# Patient Record
Sex: Female | Born: 1970
Health system: Southern US, Community
[De-identification: ages and names within clinical notes are randomized; demographics above are authoritative.]

## PROBLEM LIST (undated history)

## (undated) DIAGNOSIS — I10 Essential (primary) hypertension: Secondary | ICD-10-CM

## (undated) HISTORY — PX: POLYPECTOMY: SHX149

## (undated) HISTORY — PX: DILATION AND CURETTAGE OF UTERUS: SHX78

---

## 1997-12-02 ENCOUNTER — Encounter: Admission: RE | Admit: 1997-12-02 | Discharge: 1997-12-02 | Payer: Self-pay | Admitting: Internal Medicine

## 1998-09-04 ENCOUNTER — Other Ambulatory Visit: Admission: RE | Admit: 1998-09-04 | Discharge: 1998-09-04 | Payer: Self-pay | Admitting: Obstetrics and Gynecology

## 1998-10-05 ENCOUNTER — Ambulatory Visit (HOSPITAL_COMMUNITY): Admission: RE | Admit: 1998-10-05 | Discharge: 1998-10-05 | Payer: Self-pay | Admitting: Infectious Diseases

## 1999-05-31 HISTORY — PX: NOVASURE ABLATION: SHX5394

## 1999-10-05 ENCOUNTER — Other Ambulatory Visit: Admission: RE | Admit: 1999-10-05 | Discharge: 1999-10-05 | Payer: Self-pay | Admitting: Obstetrics and Gynecology

## 2000-11-15 ENCOUNTER — Other Ambulatory Visit: Admission: RE | Admit: 2000-11-15 | Discharge: 2000-11-15 | Payer: Self-pay | Admitting: Obstetrics and Gynecology

## 2002-03-06 ENCOUNTER — Other Ambulatory Visit: Admission: RE | Admit: 2002-03-06 | Discharge: 2002-03-06 | Payer: Self-pay | Admitting: Obstetrics and Gynecology

## 2003-02-16 ENCOUNTER — Inpatient Hospital Stay (HOSPITAL_COMMUNITY): Admission: AD | Admit: 2003-02-16 | Discharge: 2003-02-16 | Payer: Self-pay | Admitting: Obstetrics and Gynecology

## 2003-04-08 ENCOUNTER — Other Ambulatory Visit: Admission: RE | Admit: 2003-04-08 | Discharge: 2003-04-08 | Payer: Self-pay | Admitting: Obstetrics and Gynecology

## 2004-11-02 ENCOUNTER — Inpatient Hospital Stay (HOSPITAL_COMMUNITY): Admission: AD | Admit: 2004-11-02 | Discharge: 2004-11-02 | Payer: Self-pay | Admitting: Obstetrics and Gynecology

## 2005-04-06 ENCOUNTER — Other Ambulatory Visit: Admission: RE | Admit: 2005-04-06 | Discharge: 2005-04-06 | Payer: Self-pay | Admitting: Family Medicine

## 2005-12-06 ENCOUNTER — Ambulatory Visit: Payer: Self-pay | Admitting: Family Medicine

## 2006-01-16 ENCOUNTER — Ambulatory Visit: Payer: Self-pay | Admitting: Family Medicine

## 2006-03-30 ENCOUNTER — Ambulatory Visit: Payer: Self-pay | Admitting: Family Medicine

## 2006-03-30 LAB — CONVERTED CEMR LAB
Cholesterol: 164 mg/dL (ref 0–200)
HDL: 35 mg/dL — ABNORMAL LOW (ref >39.0)
LDL Cholesterol: 121 mg/dL — ABNORMAL HIGH (ref 0–99)
MCV: 78.9 fL (ref 78.0–100.0)
Platelets: 378 10*3/uL (ref 150–400)
RBC: 4.52 M/uL (ref 3.87–5.11)
RDW: 12.3 % (ref 11.5–14.6)
Triglyceride fasting, serum: 40 mg/dL (ref 0–149)
VLDL: 8 mg/dL (ref 0–40)

## 2006-04-28 ENCOUNTER — Encounter: Admission: RE | Admit: 2006-04-28 | Discharge: 2006-07-27 | Payer: Self-pay | Admitting: Family Medicine

## 2006-07-12 DIAGNOSIS — I10 Essential (primary) hypertension: Secondary | ICD-10-CM | POA: Insufficient documentation

## 2006-08-30 ENCOUNTER — Encounter: Payer: Self-pay | Admitting: Family Medicine

## 2006-08-30 ENCOUNTER — Ambulatory Visit: Payer: Self-pay | Admitting: Family Medicine

## 2006-08-30 DIAGNOSIS — J309 Allergic rhinitis, unspecified: Secondary | ICD-10-CM | POA: Insufficient documentation

## 2006-08-30 DIAGNOSIS — J019 Acute sinusitis, unspecified: Secondary | ICD-10-CM

## 2006-12-04 ENCOUNTER — Telehealth (INDEPENDENT_AMBULATORY_CARE_PROVIDER_SITE_OTHER): Payer: Self-pay | Admitting: *Deleted

## 2007-05-16 ENCOUNTER — Ambulatory Visit: Payer: Self-pay | Admitting: Family Medicine

## 2007-05-16 DIAGNOSIS — E039 Hypothyroidism, unspecified: Secondary | ICD-10-CM | POA: Insufficient documentation

## 2007-05-17 ENCOUNTER — Telehealth (INDEPENDENT_AMBULATORY_CARE_PROVIDER_SITE_OTHER): Payer: Self-pay | Admitting: *Deleted

## 2007-05-17 LAB — CONVERTED CEMR LAB
BUN: 6 mg/dL (ref 6–23)
Basophils Absolute: 0 10*3/uL (ref 0.0–0.1)
Basophils Relative: 0.4 % (ref 0.0–1.0)
Eosinophils Relative: 1.5 % (ref 0.0–5.0)
GFR calc Af Amer: 103 mL/min
GFR calc non Af Amer: 85 mL/min
HCT: 39.4 % (ref 36.0–46.0)
MCV: 78.4 fL (ref 78.0–100.0)
Monocytes Absolute: 0.2 10*3/uL (ref 0.2–0.7)
Neutro Abs: 4.1 10*3/uL (ref 1.4–7.7)
Platelets: 370 10*3/uL (ref 150–400)
Potassium: 3.6 meq/L (ref 3.5–5.1)
RBC: 5.02 M/uL (ref 3.87–5.11)
RDW: 13.2 % (ref 11.5–14.6)
TSH: 1.28 microintl units/mL (ref 0.35–5.50)
Total CHOL/HDL Ratio: 3.7
Triglycerides: 29 mg/dL (ref 0–149)
VLDL: 6 mg/dL (ref 0–40)
WBC: 6.7 10*3/uL (ref 4.5–10.5)

## 2007-05-18 ENCOUNTER — Telehealth (INDEPENDENT_AMBULATORY_CARE_PROVIDER_SITE_OTHER): Payer: Self-pay | Admitting: *Deleted

## 2007-07-02 ENCOUNTER — Telehealth (INDEPENDENT_AMBULATORY_CARE_PROVIDER_SITE_OTHER): Payer: Self-pay | Admitting: *Deleted

## 2008-12-17 ENCOUNTER — Encounter (INDEPENDENT_AMBULATORY_CARE_PROVIDER_SITE_OTHER): Payer: Self-pay | Admitting: Obstetrics and Gynecology

## 2008-12-17 ENCOUNTER — Ambulatory Visit (HOSPITAL_COMMUNITY): Admission: RE | Admit: 2008-12-17 | Discharge: 2008-12-17 | Payer: Self-pay | Admitting: Obstetrics and Gynecology

## 2010-09-05 LAB — CBC
Platelets: 361 10*3/uL (ref 150–400)
WBC: 6.3 10*3/uL (ref 4.0–10.5)

## 2010-09-05 LAB — BASIC METABOLIC PANEL
BUN: 8 mg/dL (ref 6–23)
GFR calc non Af Amer: >60 mL/min (ref >60)
Potassium: 3.7 mEq/L (ref 3.5–5.1)
Sodium: 136 mEq/L (ref 135–145)

## 2010-09-05 LAB — HCG, SERUM, QUALITATIVE: Preg, Serum: NEGATIVE

## 2010-10-12 NOTE — Op Note (Signed)
[name], [name]                  ACCOUNT NO.:  [id number]   MEDICAL RECORD NO.:  [id number]          PATIENT TYPE:  AMB   LOCATION:  SDC                           FACILITY:  [facility]   PHYSICIAN:  Lenoard Aden, M.D.DATE OF BIRTH:  [birth year]   DATE OF PROCEDURE:  12/17/2008  DATE OF DISCHARGE:  12/17/2008                               OPERATIVE REPORT   PREOPERATIVE DIAGNOSIS:  Menorrhagia, submucous fibroid.   POSTOPERATIVE DIAGNOSIS:  Menorrhagia, submucous fibroid.   PROCEDURE:  Diagnostic hysteroscopy, dilation and curettage,  resectoscope with myomectomy, and NovaSure endometrial ablation.   SURGEON:  Lenoard Aden, MD   ANESTHESIA:  General.   ESTIMATED BLOOD LOSS:  Less than 50 mL.   COMPLICATIONS:  None.   DRAINS:  None.   COUNTS:  Correct.   The patient went to recovery in good condition.   FLUID DEFICIT:  150 mL.   BRIEF OPERATIVE NOTE:  After being apprised of risks of anesthesia,  infection, bleeding, injury to abdominal organs, need for repair,  delayed versus immediate complications to include bowel and bladder  injury.  The patient brought to the operating, she administered general  anesthetic without complications, prepped and draped in usual sterile  fashion, catheterized still the bladder was empty.  Exam under  anesthesia was a bulky anteflexed uterus.  No adnexal masses.  Uterus  was dilated, then dilute paracervical block with 20 mL of a dilute  Marcaine solution placed in the standard fashion.  A block at the  cervicovaginal junction at 3 and 9 o'clock using a dilute Pitressin  solution was placed as well.  Cervix was then easily dilated up to #25  [facility] dilator.  Hysteroscope was placed, anterior fibroid was noted, and  resected using multiple passes of a right-angle loop with good  hemostasis noted.  Otherwise, normal endometrial cavity was noted.  Endometrial curettings were then collected.  Copious amount of  curettings in a four  quadrant method and revisualization reveals and  otherwise normal cavity.  The NovaSure device was placed and initiated  for 80 seconds after CO2 test performed and it was negative.  At this  time, the NovaSure device was  removed.  After termination of procedure, found to be intact, uterus was  intact.  No evidence of perforation after revisualization with  hysteroscopy.  Good hemostasis was noted.  Fluid deficit was noted.  All  instruments were removed.  The patient tolerated the procedure well and  was transferred to recovery room in good condition.      Lenoard Aden, M.D.  Electronically Signed     RJT/MEDQ  D:  12/17/2008  T:  12/18/2008  Job:  [id number]

## 2010-12-23 ENCOUNTER — Other Ambulatory Visit: Payer: Self-pay | Admitting: Obstetrics and Gynecology

## 2010-12-23 DIAGNOSIS — Z1231 Encounter for screening mammogram for malignant neoplasm of breast: Secondary | ICD-10-CM

## 2010-12-31 ENCOUNTER — Ambulatory Visit
Admission: RE | Admit: 2010-12-31 | Discharge: 2010-12-31 | Disposition: A | Discharge status: Home / Self Care | Payer: 59 | Source: Ambulatory Visit | Attending: Obstetrics and Gynecology | Admitting: Obstetrics and Gynecology

## 2010-12-31 DIAGNOSIS — Z1231 Encounter for screening mammogram for malignant neoplasm of breast: Secondary | ICD-10-CM

## 2011-01-04 ENCOUNTER — Other Ambulatory Visit: Payer: Self-pay | Admitting: Obstetrics and Gynecology

## 2011-01-04 DIAGNOSIS — R928 Other abnormal and inconclusive findings on diagnostic imaging of breast: Secondary | ICD-10-CM

## 2011-01-11 ENCOUNTER — Ambulatory Visit
Admission: RE | Admit: 2011-01-11 | Discharge: 2011-01-11 | Disposition: A | Discharge status: Home / Self Care | Payer: 59 | Source: Ambulatory Visit | Attending: Obstetrics and Gynecology | Admitting: Obstetrics and Gynecology

## 2011-01-11 DIAGNOSIS — R928 Other abnormal and inconclusive findings on diagnostic imaging of breast: Secondary | ICD-10-CM

## 2011-04-14 ENCOUNTER — Other Ambulatory Visit: Payer: Self-pay | Admitting: Family Medicine

## 2011-04-14 ENCOUNTER — Ambulatory Visit
Admission: RE | Admit: 2011-04-14 | Discharge: 2011-04-14 | Disposition: A | Discharge status: Home / Self Care | Payer: 59 | Source: Ambulatory Visit | Attending: Family Medicine | Admitting: Family Medicine

## 2011-04-14 DIAGNOSIS — R1013 Epigastric pain: Secondary | ICD-10-CM

## 2012-01-05 ENCOUNTER — Other Ambulatory Visit: Payer: Self-pay | Admitting: Obstetrics and Gynecology

## 2012-01-05 DIAGNOSIS — Z1231 Encounter for screening mammogram for malignant neoplasm of breast: Secondary | ICD-10-CM

## 2012-02-08 ENCOUNTER — Ambulatory Visit
Admission: RE | Admit: 2012-02-08 | Discharge: 2012-02-08 | Disposition: A | Discharge status: Home / Self Care | Payer: 59 | Source: Ambulatory Visit | Attending: Obstetrics and Gynecology | Admitting: Obstetrics and Gynecology

## 2012-02-08 DIAGNOSIS — Z1231 Encounter for screening mammogram for malignant neoplasm of breast: Secondary | ICD-10-CM

## 2012-07-03 ENCOUNTER — Encounter (HOSPITAL_COMMUNITY): Payer: Self-pay | Admitting: Pharmacist

## 2012-07-11 ENCOUNTER — Encounter (HOSPITAL_COMMUNITY): Payer: Self-pay

## 2012-07-11 ENCOUNTER — Encounter (HOSPITAL_COMMUNITY)
Admission: RE | Admit: 2012-07-11 | Discharge: 2012-07-11 | Disposition: A | Discharge status: Home / Self Care | Payer: 59 | Source: Ambulatory Visit | Attending: Obstetrics and Gynecology | Admitting: Obstetrics and Gynecology

## 2012-07-11 HISTORY — DX: Essential (primary) hypertension: I10

## 2012-07-11 LAB — CBC
HCT: 40.4 % (ref 36.0–46.0)
Hemoglobin: 13.2 g/dL (ref 12.0–15.0)
MCV: 81.3 fL (ref 78.0–100.0)
RBC: 4.97 MIL/uL (ref 3.87–5.11)
RDW: 13.5 % (ref 11.5–15.5)
WBC: 7.8 10*3/uL (ref 4.0–10.5)

## 2012-07-11 LAB — BASIC METABOLIC PANEL
BUN: 14 mg/dL (ref 6–23)
CO2: 26 mEq/L (ref 19–32)
Chloride: 99 mEq/L (ref 96–112)
Creatinine, Ser: 0.92 mg/dL (ref 0.50–1.10)
Glucose, Bld: 127 mg/dL — ABNORMAL HIGH (ref 70–99)

## 2012-07-11 NOTE — Patient Instructions (Addendum)
20 [name]  07/11/2012   Your procedure is scheduled on:  07/17/12  Enter through the Main Entrance of [facility] at 1130 AM.  Pick up the phone at the desk and dial [phone].   Call this number if you have problems the morning of surgery: [phone]   Remember:   Do not eat food:After Midnight.  Do not drink clear liquids: 4 Hours before arrival.  Take these medicines the morning of surgery with A SIP OF WATER: Procardia, and Maxzide   Do not wear jewelry, make-up or nail polish.  Do not wear lotions, powders, or perfumes. You may wear deodorant.  Do not shave 48 hours prior to surgery.  Do not bring valuables to the hospital.  Contacts, dentures or bridgework may not be worn into surgery.  Leave suitcase in the car. After surgery it may be brought to your room.  For patients admitted to the hospital, checkout time is 11:00 AM the day of discharge.   Patients discharged the day of surgery will not be allowed to drive home.  Name and phone number of your driver: NA  Special Instructions: Shower using CHG 2 nights before surgery and the night before surgery.  If you shower the day of surgery use CHG.  Use special wash - you have one bottle of CHG for all showers.  You should use approximately 1/3 of the bottle for each shower.   Please read over the following fact sheets that you were given: MRSA Information

## 2012-07-16 ENCOUNTER — Other Ambulatory Visit: Payer: Self-pay | Admitting: Obstetrics and Gynecology

## 2012-07-17 ENCOUNTER — Encounter (HOSPITAL_COMMUNITY): Payer: Self-pay | Admitting: Anesthesiology

## 2012-07-17 ENCOUNTER — Encounter (HOSPITAL_COMMUNITY): Payer: Self-pay | Admitting: Registered Nurse

## 2012-07-17 ENCOUNTER — Encounter (HOSPITAL_COMMUNITY)
Admission: RE | Disposition: A | Discharge status: Home / Self Care | Payer: Self-pay | Source: Ambulatory Visit | Attending: Obstetrics and Gynecology

## 2012-07-17 ENCOUNTER — Ambulatory Visit (HOSPITAL_COMMUNITY)
Admission: RE | Admit: 2012-07-17 | Discharge: 2012-07-18 | Disposition: A | Discharge status: Home / Self Care | Payer: 59 | Source: Ambulatory Visit | Attending: Obstetrics and Gynecology | Admitting: Obstetrics and Gynecology

## 2012-07-17 ENCOUNTER — Ambulatory Visit (HOSPITAL_COMMUNITY): Payer: 59 | Admitting: Anesthesiology

## 2012-07-17 DIAGNOSIS — I519 Heart disease, unspecified: Secondary | ICD-10-CM | POA: Insufficient documentation

## 2012-07-17 DIAGNOSIS — R112 Nausea with vomiting, unspecified: Secondary | ICD-10-CM | POA: Insufficient documentation

## 2012-07-17 DIAGNOSIS — N803 Endometriosis of pelvic peritoneum, unspecified: Secondary | ICD-10-CM | POA: Insufficient documentation

## 2012-07-17 DIAGNOSIS — N920 Excessive and frequent menstruation with regular cycle: Secondary | ICD-10-CM | POA: Insufficient documentation

## 2012-07-17 DIAGNOSIS — I10 Essential (primary) hypertension: Secondary | ICD-10-CM | POA: Insufficient documentation

## 2012-07-17 DIAGNOSIS — N946 Dysmenorrhea, unspecified: Secondary | ICD-10-CM | POA: Insufficient documentation

## 2012-07-17 DIAGNOSIS — E119 Type 2 diabetes mellitus without complications: Secondary | ICD-10-CM | POA: Insufficient documentation

## 2012-07-17 DIAGNOSIS — N949 Unspecified condition associated with female genital organs and menstrual cycle: Secondary | ICD-10-CM | POA: Insufficient documentation

## 2012-07-17 HISTORY — PX: ROBOTIC ASSISTED TOTAL HYSTERECTOMY: SHX6085

## 2012-07-17 HISTORY — PX: BILATERAL SALPINGECTOMY: SHX5743

## 2012-07-17 SURGERY — ROBOTIC ASSISTED TOTAL HYSTERECTOMY
Anesthesia: General | Site: Abdomen | Wound class: Clean Contaminated

## 2012-07-17 MED ORDER — DEXAMETHASONE SODIUM PHOSPHATE 10 MG/ML IJ SOLN
INTRAMUSCULAR | Status: AC
  Filled 2012-07-17: qty 1

## 2012-07-17 MED ORDER — ZOLPIDEM TARTRATE 5 MG PO TABS: 5.0000 mg | ORAL_TABLET | Freq: Every evening | ORAL | Status: DC | PRN

## 2012-07-17 MED ORDER — ONDANSETRON HCL 4 MG/2ML IJ SOLN
INTRAMUSCULAR | Status: DC | PRN
  Administered 2012-07-17: 4 mg via INTRAVENOUS

## 2012-07-17 MED ORDER — LACTATED RINGERS IV SOLN
INTRAVENOUS | Status: DC
  Administered 2012-07-17: 12:00:00 via INTRAVENOUS

## 2012-07-17 MED ORDER — GLYCOPYRROLATE 0.2 MG/ML IJ SOLN
INTRAMUSCULAR | Status: DC | PRN
  Administered 2012-07-17: 0.4 mg via INTRAVENOUS

## 2012-07-17 MED ORDER — TRIAMTERENE-HCTZ 37.5-25 MG PO CAPS
1.0000 | ORAL_CAPSULE | Freq: Every day | ORAL | Status: DC
  Administered 2012-07-18: 1 via ORAL
  Filled 2012-07-17 (×2): qty 1

## 2012-07-17 MED ORDER — CEFAZOLIN SODIUM-DEXTROSE 2-3 GM-% IV SOLR
INTRAVENOUS | Status: AC
  Administered 2012-07-17: 2 g via INTRAVENOUS
  Filled 2012-07-17: qty 50

## 2012-07-17 MED ORDER — ROCURONIUM BROMIDE 100 MG/10ML IV SOLN
INTRAVENOUS | Status: DC | PRN
  Administered 2012-07-17: 45 mg via INTRAVENOUS
  Administered 2012-07-17: 5 mg via INTRAVENOUS

## 2012-07-17 MED ORDER — KCL-LACTATED RINGERS-D5W 20 MEQ/L IV SOLN
INTRAVENOUS | Status: DC
  Filled 2012-07-17 (×2): qty 1000

## 2012-07-17 MED ORDER — ARTIFICIAL TEARS OP OINT
TOPICAL_OINTMENT | OPHTHALMIC | Status: AC
  Filled 2012-07-17: qty 3.5

## 2012-07-17 MED ORDER — NIFEDIPINE ER 60 MG PO TB24
60.0000 mg | ORAL_TABLET | Freq: Every day | ORAL | Status: DC
  Administered 2012-07-18: 60 mg via ORAL
  Filled 2012-07-17 (×2): qty 1

## 2012-07-17 MED ORDER — SODIUM CHLORIDE 0.9 % IJ SOLN: 9.0000 mL | INTRAMUSCULAR | Status: DC | PRN

## 2012-07-17 MED ORDER — MIDAZOLAM HCL 2 MG/2ML IJ SOLN
INTRAMUSCULAR | Status: AC
  Filled 2012-07-17: qty 2

## 2012-07-17 MED ORDER — FENTANYL CITRATE 0.05 MG/ML IJ SOLN
INTRAMUSCULAR | Status: AC
  Filled 2012-07-17: qty 5

## 2012-07-17 MED ORDER — TRAMADOL HCL 50 MG PO TABS: 50.0000 mg | ORAL_TABLET | Freq: Four times a day (QID) | ORAL | Status: DC | PRN

## 2012-07-17 MED ORDER — DIPHENHYDRAMINE HCL 12.5 MG/5ML PO ELIX: 12.5000 mg | ORAL_SOLUTION | Freq: Four times a day (QID) | ORAL | Status: DC | PRN

## 2012-07-17 MED ORDER — DIPHENHYDRAMINE HCL 50 MG/ML IJ SOLN: 12.5000 mg | Freq: Four times a day (QID) | INTRAMUSCULAR | Status: DC | PRN

## 2012-07-17 MED ORDER — NALOXONE HCL 0.4 MG/ML IJ SOLN: 0.4000 mg | INTRAMUSCULAR | Status: DC | PRN

## 2012-07-17 MED ORDER — NEOSTIGMINE METHYLSULFATE 1 MG/ML IJ SOLN
INTRAMUSCULAR | Status: AC
  Filled 2012-07-17: qty 1

## 2012-07-17 MED ORDER — PROPOFOL 10 MG/ML IV BOLUS
INTRAVENOUS | Status: DC | PRN
  Administered 2012-07-17: 150 mg via INTRAVENOUS

## 2012-07-17 MED ORDER — CEFAZOLIN SODIUM-DEXTROSE 2-3 GM-% IV SOLR: 2.0000 g | INTRAVENOUS | Status: DC

## 2012-07-17 MED ORDER — LIDOCAINE HCL (CARDIAC) 20 MG/ML IV SOLN
INTRAVENOUS | Status: AC
  Filled 2012-07-17: qty 5

## 2012-07-17 MED ORDER — LIDOCAINE HCL (CARDIAC) 20 MG/ML IV SOLN
INTRAVENOUS | Status: DC | PRN
  Administered 2012-07-17: 80 mg via INTRAVENOUS

## 2012-07-17 MED ORDER — ARTIFICIAL TEARS OP OINT
TOPICAL_OINTMENT | OPHTHALMIC | Status: DC | PRN
  Administered 2012-07-17: 1 via OPHTHALMIC

## 2012-07-17 MED ORDER — MIDAZOLAM HCL 5 MG/5ML IJ SOLN
INTRAMUSCULAR | Status: DC | PRN
  Administered 2012-07-17: 1 mg via INTRAVENOUS

## 2012-07-17 MED ORDER — ROPIVACAINE HCL 5 MG/ML IJ SOLN
INTRAMUSCULAR | Status: AC
  Filled 2012-07-17: qty 60

## 2012-07-17 MED ORDER — PROPOFOL 10 MG/ML IV EMUL
INTRAVENOUS | Status: AC
  Filled 2012-07-17: qty 20

## 2012-07-17 MED ORDER — PROMETHAZINE HCL 25 MG/ML IJ SOLN: 6.2500 mg | INTRAMUSCULAR | Status: DC | PRN

## 2012-07-17 MED ORDER — FENTANYL CITRATE 0.05 MG/ML IJ SOLN
INTRAMUSCULAR | Status: AC
  Administered 2012-07-17: 25 ug via INTRAVENOUS
  Filled 2012-07-17: qty 2

## 2012-07-17 MED ORDER — FENTANYL CITRATE 0.05 MG/ML IJ SOLN
INTRAMUSCULAR | Status: DC | PRN
  Administered 2012-07-17: 100 ug via INTRAVENOUS
  Administered 2012-07-17 (×2): 50 ug via INTRAVENOUS

## 2012-07-17 MED ORDER — ONDANSETRON HCL 4 MG/2ML IJ SOLN
INTRAMUSCULAR | Status: AC
  Filled 2012-07-17: qty 2

## 2012-07-17 MED ORDER — OXYCODONE-ACETAMINOPHEN 5-325 MG PO TABS
1.0000 | ORAL_TABLET | ORAL | Status: DC | PRN
  Administered 2012-07-18: 1 via ORAL
  Filled 2012-07-17: qty 1

## 2012-07-17 MED ORDER — NEOSTIGMINE METHYLSULFATE 1 MG/ML IJ SOLN
INTRAMUSCULAR | Status: DC | PRN
  Administered 2012-07-17: 3 mg via INTRAVENOUS

## 2012-07-17 MED ORDER — FENTANYL CITRATE 0.05 MG/ML IJ SOLN
25.0000 ug | INTRAMUSCULAR | Status: DC | PRN
  Administered 2012-07-17: 25 ug via INTRAVENOUS
  Administered 2012-07-17: 50 ug via INTRAVENOUS
  Administered 2012-07-17: 25 ug via INTRAVENOUS
  Administered 2012-07-17: 50 ug via INTRAVENOUS

## 2012-07-17 MED ORDER — ROCURONIUM BROMIDE 50 MG/5ML IV SOLN
INTRAVENOUS | Status: AC
  Filled 2012-07-17: qty 1

## 2012-07-17 MED ORDER — GLYCOPYRROLATE 0.2 MG/ML IJ SOLN
INTRAMUSCULAR | Status: AC
  Filled 2012-07-17: qty 3

## 2012-07-17 MED ORDER — MEPERIDINE HCL 25 MG/ML IJ SOLN: 6.2500 mg | INTRAMUSCULAR | Status: DC | PRN

## 2012-07-17 MED ORDER — BUPIVACAINE HCL (PF) 0.25 % IJ SOLN
INTRAMUSCULAR | Status: AC
  Filled 2012-07-17: qty 30

## 2012-07-17 MED ORDER — POTASSIUM CHLORIDE 2 MEQ/ML IV SOLN
INTRAVENOUS | Status: DC
  Administered 2012-07-17 – 2012-07-18 (×2): via INTRAVENOUS
  Filled 2012-07-17 (×5): qty 1000

## 2012-07-17 MED ORDER — ACETAMINOPHEN 10 MG/ML IV SOLN
1000.0000 mg | Freq: Once | INTRAVENOUS | Status: AC
  Administered 2012-07-17: 1000 mg via INTRAVENOUS
  Filled 2012-07-17: qty 100

## 2012-07-17 MED ORDER — DEXAMETHASONE SODIUM PHOSPHATE 10 MG/ML IJ SOLN
INTRAMUSCULAR | Status: DC | PRN
  Administered 2012-07-17: 10 mg via INTRAVENOUS

## 2012-07-17 MED ORDER — AZITHROMYCIN 250 MG PO TABS
250.0000 mg | ORAL_TABLET | Freq: Every day | ORAL | Status: DC
  Administered 2012-07-18: 250 mg via ORAL
  Filled 2012-07-17 (×2): qty 1

## 2012-07-17 MED ORDER — ROPIVACAINE HCL 5 MG/ML IJ SOLN
INTRAMUSCULAR | Status: DC | PRN
  Administered 2012-07-17: 10 mL via EPIDURAL
  Administered 2012-07-17: 60 mL via EPIDURAL

## 2012-07-17 MED ORDER — MIDAZOLAM HCL 2 MG/2ML IJ SOLN: 0.5000 mg | Freq: Once | INTRAMUSCULAR | Status: DC | PRN

## 2012-07-17 MED ORDER — ONDANSETRON HCL 4 MG/2ML IJ SOLN
4.0000 mg | Freq: Four times a day (QID) | INTRAMUSCULAR | Status: DC | PRN
  Administered 2012-07-18: 4 mg via INTRAVENOUS
  Filled 2012-07-17: qty 2

## 2012-07-17 MED ORDER — HYDROMORPHONE 0.3 MG/ML IV SOLN
INTRAVENOUS | Status: DC
  Administered 2012-07-17: 2.99 mg via INTRAVENOUS
  Administered 2012-07-17: 17:00:00 via INTRAVENOUS
  Administered 2012-07-18: 0.599 mg via INTRAVENOUS
  Administered 2012-07-18: 1.19 mg via INTRAVENOUS
  Filled 2012-07-17: qty 25

## 2012-07-17 MED ORDER — KETOROLAC TROMETHAMINE 30 MG/ML IJ SOLN: 15.0000 mg | Freq: Once | INTRAMUSCULAR | Status: DC | PRN

## 2012-07-17 SURGICAL SUPPLY — 63 items
ADH SKN CLS APL DERMABOND .7 (GAUZE/BANDAGES/DRESSINGS) ×2
BAG URINE DRAINAGE (UROLOGICAL SUPPLIES) ×3 IMPLANT
BARRIER ADHS 3X4 INTERCEED (GAUZE/BANDAGES/DRESSINGS) ×3 IMPLANT
BRR ADH 4X3 ABS CNTRL BYND (GAUZE/BANDAGES/DRESSINGS) ×2
CATH FOLEY 3WAY  5CC 16FR (CATHETERS) ×1
CATH FOLEY 3WAY 5CC 16FR (CATHETERS) ×2 IMPLANT
CHLORAPREP W/TINT 26ML (MISCELLANEOUS) ×3 IMPLANT
CLOTH BEACON ORANGE TIMEOUT ST (SAFETY) ×3 IMPLANT
CONT PATH 16OZ SNAP LID 3702 (MISCELLANEOUS) ×3 IMPLANT
COVER MAYO STAND STRL (DRAPES) ×3 IMPLANT
COVER TABLE BACK 60X90 (DRAPES) ×6 IMPLANT
COVER TIP SHEARS 8 DVNC (MISCELLANEOUS) ×2 IMPLANT
COVER TIP SHEARS 8MM DA VINCI (MISCELLANEOUS) ×1
DECANTER SPIKE VIAL GLASS SM (MISCELLANEOUS) ×4 IMPLANT
DERMABOND ADVANCED (GAUZE/BANDAGES/DRESSINGS) ×1
DERMABOND ADVANCED .7 DNX12 (GAUZE/BANDAGES/DRESSINGS) ×2 IMPLANT
DRAPE HUG U DISPOSABLE (DRAPE) ×3 IMPLANT
DRAPE LG THREE QUARTER DISP (DRAPES) ×6 IMPLANT
DRAPE WARM FLUID 44X44 (DRAPE) ×3 IMPLANT
ELECT REM PT RETURN 9FT ADLT (ELECTROSURGICAL) ×3
ELECTRODE REM PT RTRN 9FT ADLT (ELECTROSURGICAL) ×2 IMPLANT
EVACUATOR SMOKE 8.L (FILTER) ×3 IMPLANT
GAUZE VASELINE 3X9 (GAUZE/BANDAGES/DRESSINGS) IMPLANT
GLOVE BIO SURGEON STRL SZ7.5 (GLOVE) ×9 IMPLANT
GOWN STRL REIN XL XLG (GOWN DISPOSABLE) ×18 IMPLANT
GYRUS RUMI II 2.5CM BLUE (DISPOSABLE) ×3
GYRUS RUMI II 3.5CM BLUE (DISPOSABLE)
GYRUS RUMI II 4.0CM BLUE (DISPOSABLE)
KIT ACCESSORY DA VINCI DISP (KITS) ×1
KIT ACCESSORY DVNC DISP (KITS) ×2 IMPLANT
LEGGING LITHOTOMY PAIR STRL (DRAPES) ×3 IMPLANT
NEEDLE INSUFFLATION 120MM (ENDOMECHANICALS) ×3 IMPLANT
PACK LAVH (CUSTOM PROCEDURE TRAY) ×3 IMPLANT
PAD PREP 24X48 CUFFED NSTRL (MISCELLANEOUS) ×6 IMPLANT
PLUG CATH AND CAP STER (CATHETERS) ×3 IMPLANT
PROTECTOR NERVE ULNAR (MISCELLANEOUS) ×6 IMPLANT
RUMI II 3.0CM BLUE KOH-EFFICIE (DISPOSABLE) IMPLANT
RUMI II GYRUS 2.5CM BLUE (DISPOSABLE) IMPLANT
RUMI II GYRUS 3.5CM BLUE (DISPOSABLE) IMPLANT
RUMI II GYRUS 4.0CM BLUE (DISPOSABLE) IMPLANT
SET CYSTO W/LG BORE CLAMP LF (SET/KITS/TRAYS/PACK) IMPLANT
SET IRRIG TUBING LAPAROSCOPIC (IRRIGATION / IRRIGATOR) ×3 IMPLANT
SOLUTION ELECTROLUBE (MISCELLANEOUS) ×3 IMPLANT
SUT VIC AB 0 CT1 27 (SUTURE) ×6
SUT VIC AB 0 CT1 27XBRD ANBCTR (SUTURE) ×4 IMPLANT
SUT VIC AB 0 CT1 27XBRD ANTBC (SUTURE) IMPLANT
SUT VICRYL 0 UR6 27IN ABS (SUTURE) ×3 IMPLANT
SUT VICRYL RAPIDE 4/0 PS 2 (SUTURE) ×6 IMPLANT
SUT VLOC 180 0 9IN  GS21 (SUTURE) ×1
SUT VLOC 180 0 9IN GS21 (SUTURE) IMPLANT
SYR 50ML LL SCALE MARK (SYRINGE) ×3 IMPLANT
SYRINGE 10CC LL (SYRINGE) ×3 IMPLANT
SYSTEM CONVERTIBLE TROCAR (TROCAR) IMPLANT
TOWEL OR 17X24 6PK STRL BLUE (TOWEL DISPOSABLE) ×9 IMPLANT
TROCAR BLADELESS OPT 12M 100M (ENDOMECHANICALS) IMPLANT
TROCAR DILATING TIP 12MM 150MM (ENDOMECHANICALS) ×3 IMPLANT
TROCAR DISP BLADELESS 8 DVNC (TROCAR) ×2 IMPLANT
TROCAR DISP BLADELESS 8MM (TROCAR) ×1
TROCAR XCEL 12X100 BLDLESS (ENDOMECHANICALS) IMPLANT
TROCAR XCEL NON-BLD 5MMX100MML (ENDOMECHANICALS) ×3 IMPLANT
TUBING FILTER THERMOFLATOR (ELECTROSURGICAL) ×3 IMPLANT
WARMER LAPAROSCOPE (MISCELLANEOUS) ×3 IMPLANT
WATER STERILE IRR 1000ML POUR (IV SOLUTION) ×9 IMPLANT

## 2012-07-17 NOTE — Progress Notes (Signed)
Patient ID: [name], female   DOB: 1971-01-18, 42 y.o.   MRN: [id number] Patient seen and examined. Consent witnessed and signed. No changes noted. Update completed.

## 2012-07-17 NOTE — Transfer of Care (Signed)
Immediate Anesthesia Transfer of Care Note  Patient: [name]  Procedure(s) Performed: Procedure(s): ROBOTIC ASSISTED TOTAL HYSTERECTOMY (N/A) BILATERAL SALPINGECTOMY (Bilateral)  Patient Location: PACU  Anesthesia Type:General  Level of Consciousness: awake, alert  and oriented  Airway & Oxygen Therapy: Patient Spontanous Breathing and Patient connected to nasal cannula oxygen  Post-op Assessment: Report given to PACU RN and Post -op Vital signs reviewed and stable  Post vital signs: Reviewed and stable  Complications: No apparent anesthesia complications

## 2012-07-17 NOTE — Anesthesia Postprocedure Evaluation (Signed)
  Anesthesia Post Note  Patient: [name]  Procedure(s) Performed: Procedure(s) (LRB): ROBOTIC ASSISTED TOTAL HYSTERECTOMY (N/A) BILATERAL SALPINGECTOMY (Bilateral)  Anesthesia type: GA  Patient location: PACU  Post pain: Pain level controlled  Post assessment: Post-op Vital signs reviewed  Last Vitals:  Filed Vitals:   07/17/12 1600  BP: 118/76  Pulse: 71  Temp:   Resp: 20    Post vital signs: Reviewed  Level of consciousness: sedated  Complications: No apparent anesthesia complications

## 2012-07-17 NOTE — Op Note (Signed)
07/17/2012  3:32 PM  PATIENT:  [name]  42 y.o. female  PRE-OPERATIVE DIAGNOSIS:  Fibroids, Menorrhagia  POST-OPERATIVE DIAGNOSIS:  Fibroids, Menorrhagia Pelvic adhesions Enterocele Endometriosis  PROCEDURE:  Procedure(s): ROBOTIC ASSISTED TOTAL HYSTERECTOMY BILATERAL SALPINGECTOMY Lysis of Right adnexal Adhesions Excision of cul-de-sac endometriosis McCall Cul de plasty  SURGEON:  Surgeon(s): Lenoard Aden, MD Genia Del, MD  ASSISTANTS: Seymour Bars   ANESTHESIA:   local and general  ESTIMATED BLOOD LOSS: 100  DRAINS: Urinary Catheter (Foley)   LOCAL MEDICATIONS USED:  MARCAINE     SPECIMEN:  Source of Specimen:  uterus, cervix , bilateral tubes, peritoneal endometriosis  DISPOSITION OF SPECIMEN:  PATHOLOGY  COUNTS:  YES  DICTATION #: [id number]  PLAN OF CARE: 24 hour observation  PATIENT DISPOSITION:  PACU - hemodynamically stable.

## 2012-07-17 NOTE — Preoperative (Signed)
Beta Blockers   Reason not to administer Beta Blockers:Not Applicable 

## 2012-07-17 NOTE — Anesthesia Preprocedure Evaluation (Signed)
Anesthesia Evaluation  Patient identified by MRN, date of birth, ID band Patient awake    Reviewed: Allergy & Precautions, H&P , Patient's Chart, lab work & pertinent test results, reviewed documented beta blocker date and time   History of Anesthesia Complications Negative for: history of anesthetic complications  Airway Mallampati: II TM Distance: >3 FB Neck ROM: full    Dental no notable dental hx.    Pulmonary neg pulmonary ROS,  breath sounds clear to auscultation  Pulmonary exam normal       Cardiovascular Exercise Tolerance: Good hypertension, negative cardio ROS  Rhythm:regular Rate:Normal     Neuro/Psych negative neurological ROS  negative psych ROS   GI/Hepatic negative GI ROS, Neg liver ROS,   Endo/Other  negative endocrine ROSHypothyroidism   Renal/GU negative Renal ROS     Musculoskeletal   Abdominal   Peds  Hematology negative hematology ROS (+)   Anesthesia Other Findings   Reproductive/Obstetrics negative OB ROS                           Anesthesia Physical Anesthesia Plan  ASA: II  Anesthesia Plan: General ETT   Post-op Pain Management:    Induction:   Airway Management Planned:   Additional Equipment:   Intra-op Plan:   Post-operative Plan:   Informed Consent: I have reviewed the patients History and Physical, chart, labs and discussed the procedure including the risks, benefits and alternatives for the proposed anesthesia with the patient or authorized representative who has indicated his/her understanding and acceptance.   Dental Advisory Given  Plan Discussed with: CRNA and Surgeon  Anesthesia Plan Comments:         Anesthesia Quick Evaluation

## 2012-07-17 NOTE — Op Note (Signed)
[name], [name]                  ACCOUNT NO.:  [id number]  MEDICAL RECORD NO.:  [id number]  LOCATION:  9311                          FACILITY:  [facility]  PHYSICIAN:  Lenoard Aden, M.D.DATE OF BIRTH:  [birth year]  DATE OF PROCEDURE: DATE OF DISCHARGE:                              OPERATIVE REPORT   DESCRIPTION OF PROCEDURE:  After being apprised of the risks of anesthesia, infection, bleeding, injury to abdominal organs, need for repair, delayed versus immediate complications including bowel and bladder injury, the patient was brought the operating room where she was administered general anesthetic without complications, prepped and draped in usual sterile fashion.  Foley catheter placed.  SCDs placed and feet were placed in Yellofin stirrups.  After achieving adequate anesthesia, exam under anesthesia revealed a bulky anteflexed uterus and no adnexal masses.  At this time, the laparoscope was placed making a supraumbilical incision.  A Veress needle was placed with opening pressure of -2.  Three liters of CO2 was insufflated without difficulty. Trocar was placed atraumatically.  Pictures were taken.  Visualization reveals right adnexal adhesions, endometriosis in the anterior cul-de- sac, normal liver and gallbladder bed, normal appendiceal area, and normal splenic flexure.  At this time, due to previous history of ablation, the uterus was visualized with a RUMI retractor was placed. The uterus was perforated in the posterior fundal area purposefully for placement of the RUMI retractor which was then secured in the usual sterile fashion.  At this time, the robotic ports were placed, 2 on the right, 1 on the left and a 5-mm port on the left, 8 mm ports and 5 mm port placed atraumatically under direct visualization after achieving steep Trendelenburg position.  Atraumatic entry noted.  At this time, robot was docked in a standard fashion in the right side docking maneuver.  The  instruments placed are the Prograsp, the Endo Shears, and the PK forceps.  At this time, the ureter was identified on the left side.  The left mesosalpinx was cauterized and divided sharply.  The left retroperitoneal space was entered sharply.  The ureter was identified along the medial oblique peritoneum.  The ovarian ligament was cauterized and divided on the left and the uterine vessels skeletonized posteriorly.  Anteriorly, the bladder flap was developed sharply after division of the left round ligament.  The RUMI cup was identified in the anterior area and the bladder flap was developed sharply.  Attention was turned to the right side whereby the right tube was adherent into the right pelvic sidewall.  The right mesosalpinx was cauterized and divided.  The retroperitoneal space was entered, and the tube was then dissected off the right peritoneal sidewall with some evidence of endometriosis noted which was excised as specimen.  The retroperitoneal space was entered.  The ovarian ligaments cauterized and divided.  The uterine vessels were skeletonized posteriorly.  The round ligament was divided.  At this time, there was a vesicle-like endometriotic implant along the right lateral peritoneum and anterior cul-de-sac.  This was excised while developing the bladder flap and excised and placed with the specimen which will be sent for permanent section.  The uterine vessels were further skeletonized  on the right. Bilaterally, uterine vessels were cauterized and then both divided.  The RUMI cup was identified circumferentially and using the Endo Shears, the RUMI cup was exposed in a 360 degree fashion.  The uterus due to its size then bivalved without difficulty, retracted, and removed through the vagina.  At this time, hemostasis was achieved.  The vaginal vault was closed in transverse fashion using a 0 V-Loc suture.  A McCall culdoplasty suture was placed.  At the end, the sutures were  removed. The ureters were noted to be of normal caliber and peristalsing bilaterally.  The urine was clear.  Good hemostasis was noted. Irrigation was accomplished.  Both ovaries appeared normal.  Pictures were taken.  At this time, all robotic ports and the robotic instruments were removed with the robot being undocked. Incisions were closed after removal of all trocars and release of CO2. The incisions were closed using a 0 Vicryl, 4-0 Vicryl, and Dermabond. Vaginal exam reveals vaginal cuff to be well approximated without evidence of suture in the vagina.  The patient tolerated the procedure well, was awakened, and transferred to recovery in good condition.     Lenoard Aden, M.D.     RJT/MEDQ  D:  07/17/2012  T:  07/17/2012  Job:  [id number]

## 2012-07-18 ENCOUNTER — Encounter (HOSPITAL_COMMUNITY): Payer: Self-pay | Admitting: Obstetrics and Gynecology

## 2012-07-18 LAB — CBC
HCT: 33.5 % — ABNORMAL LOW (ref 36.0–46.0)
Hemoglobin: 11 g/dL — ABNORMAL LOW (ref 12.0–15.0)
MCH: 26.3 pg (ref 26.0–34.0)
MCV: 80.1 fL (ref 78.0–100.0)
Platelets: 302 10*3/uL (ref 150–400)
RBC: 4.18 MIL/uL (ref 3.87–5.11)
WBC: 13.1 10*3/uL — ABNORMAL HIGH (ref 4.0–10.5)

## 2012-07-18 LAB — BASIC METABOLIC PANEL
BUN: 9 mg/dL (ref 6–23)
CO2: 29 mEq/L (ref 19–32)
Calcium: 9.4 mg/dL (ref 8.4–10.5)
Chloride: 100 mEq/L (ref 96–112)
Creatinine, Ser: 0.86 mg/dL (ref 0.50–1.10)
Glucose, Bld: 157 mg/dL — ABNORMAL HIGH (ref 70–99)

## 2012-07-18 MED ORDER — OXYCODONE-ACETAMINOPHEN 5-325 MG PO TABS: 1.0000 | ORAL_TABLET | ORAL | Status: AC | PRN

## 2012-07-18 MED ORDER — TRAMADOL HCL 50 MG PO TABS: 50.0000 mg | ORAL_TABLET | Freq: Four times a day (QID) | ORAL | Status: AC | PRN

## 2012-07-18 NOTE — H&P (Signed)
[name], [name]                  ACCOUNT NO.:  [id number]  MEDICAL RECORD NO.:  [id number]  LOCATION:                                 FACILITY:  PHYSICIAN:  Lenoard Aden, M.D.DATE OF BIRTH:  01-28-1971  DATE OF ADMISSION: DATE OF DISCHARGE:                             HISTORY & PHYSICAL   DATE OF OUTPATIENT SURGERY:  July 17, 2012  CHIEF COMPLAINT:  Pelvic pain, dysmenorrhea.  She is a 42 year old female G0 P0 with history of failed endometrial ablation who presents for definitive surgery.    Family Hx: heart disease, hypertension  PMHx: Menorrhagia Fibroids HTN Carbohydrate intolerance  No known drug allergies.   MEDICATIONS:  Include Procardia, vitamin D and triamterene-HCTZ  PHYSICAL EXAMINATION:  VITAL SIGNS:  Her blood pressure 122/70 HEENT:  Normal. NECK:  Supple.  Full range of motion. LUNGS:  Clear. HEART:  Regular rhythm. ABDOMEN:  Soft, nontender.  Pelvic exam demonstrates 8-10 weeks Irregular uterus.  No adnexal mass. EXTREMITIES:  There are no cords. NEUROLOGIC:  Nonfocal. SKIN:  Intact.  IMPRESSION:  Uterine fibroids, dysmenorrhea and, menorrhagia and failed Endometrial ablation  PLAN:  Proceed with robotic assisted laparoscopic hysterectomy, bilateral salpingectomy.  Risks of anesthesia, infection, bleeding, injury to abdominal organs, need for repair were discussed.  Complications to include bowel and bladder injury noted.  The patient acknowledges and wishes to proceed.     Lenoard Aden, M.D.     RJT/MEDQ  D:  07/16/2012  T:  07/16/2012  Job:  [id number]

## 2012-07-18 NOTE — Anesthesia Postprocedure Evaluation (Signed)
  Anesthesia Post-op Note  Patient: [name]  Procedure(s) Performed: Procedure(s): ROBOTIC ASSISTED TOTAL HYSTERECTOMY (N/A) BILATERAL SALPINGECTOMY (Bilateral)  Patient Location: PACU and Women's Unit  Anesthesia Type:General  Level of Consciousness: awake, alert  and oriented  Airway and Oxygen Therapy: Patient Spontanous Breathing  Post-op Pain: none  Post-op Assessment: Post-op Vital signs reviewed  Post-op Vital Signs: Reviewed and stable  Complications: No apparent anesthesia complications

## 2012-07-18 NOTE — Progress Notes (Signed)
1 Day Post-Op Procedure(s) (LRB): ROBOTIC ASSISTED TOTAL HYSTERECTOMY (N/A) BILATERAL SALPINGECTOMY (Bilateral)  Subjective: Patient reports nausea, incisional pain, tolerating PO, + flatus and no problems voiding.   Occ episodes of vomiting  Objective: BP 116/75  Pulse 94  Temp(Src) 97.8 F (36.6 C) (Oral)  Resp 14  Ht 5' 3.5" (1.613 m)  Wt 74.844 kg (165 lb)  BMI 28.77 kg/m2  SpO2 98%  CBC    Component Value Date/Time   WBC 13.1* 07/18/2012 0540   RBC 4.18 07/18/2012 0540   HGB 11.0* 07/18/2012 0540   HCT 33.5* 07/18/2012 0540   PLT 302 07/18/2012 0540   MCV 80.1 07/18/2012 0540   MCH 26.3 07/18/2012 0540   MCHC 32.8 07/18/2012 0540   RDW 13.2 07/18/2012 0540   MONOABS 0.2 05/16/2007 1340   EOSABS 0.1 05/16/2007 1340   BASOSABS 0.0 05/16/2007 1340   CMP     Component Value Date/Time   NA 138 07/18/2012 0540   K 4.6 07/18/2012 0540   CL 100 07/18/2012 0540   CO2 29 07/18/2012 0540   GLUCOSE 157* 07/18/2012 0540   GLUCOSE 88 03/30/2006 0925   BUN 9 07/18/2012 0540   CREATININE 0.86 07/18/2012 0540   CALCIUM 9.4 07/18/2012 0540   GFRNONAA 82* 07/18/2012 0540   GFRAA >90 07/18/2012 0540       I have reviewed patient's vital signs, intake and output, medications and labs.  General: alert, cooperative and appears stated age Resp: clear to auscultation bilaterally and normal percussion bilaterally Cardio: regular rate and rhythm, S1, S2 normal, no murmur, click, rub or gallop and normal apical impulse GI: soft, non-tender; bowel sounds normal; no masses,  no organomegaly, normal findings: bowel sounds normal, soft, non-tender and umbilicus normal and incision: clean, dry and intact Extremities: extremities normal, atraumatic, no cyanosis or edema and Homans sign is negative, no sign of DVT minmal vaginal bleeding BS present but hypoactive, no distension noted.  No CVAT  Assessment: s/p Procedure(s): ROBOTIC ASSISTED TOTAL HYSTERECTOMY (N/A) BILATERAL SALPINGECTOMY  (Bilateral): stable, progressing well and tolerating diet Mild Nausea and vomiting Plan: Advance diet Encourage ambulation Advance to PO medication Discontinue IV fluids Discharge home  LOS: 1 day    Ngai Parcell J 07/18/2012, 7:41 AM

## 2012-07-18 NOTE — Progress Notes (Signed)
Teaching complete   Ambulated out  With husband

## 2013-01-10 ENCOUNTER — Other Ambulatory Visit: Payer: Self-pay

## 2013-01-10 DIAGNOSIS — Z1231 Encounter for screening mammogram for malignant neoplasm of breast: Secondary | ICD-10-CM

## 2013-02-08 ENCOUNTER — Ambulatory Visit
Admission: RE | Admit: 2013-02-08 | Discharge: 2013-02-08 | Disposition: A | Discharge status: Home / Self Care | Payer: 59 | Source: Ambulatory Visit

## 2013-02-08 DIAGNOSIS — Z1231 Encounter for screening mammogram for malignant neoplasm of breast: Secondary | ICD-10-CM

## 2014-01-21 ENCOUNTER — Other Ambulatory Visit: Payer: Self-pay

## 2014-01-21 DIAGNOSIS — Z1231 Encounter for screening mammogram for malignant neoplasm of breast: Secondary | ICD-10-CM

## 2014-02-11 ENCOUNTER — Ambulatory Visit
Admission: RE | Admit: 2014-02-11 | Discharge: 2014-02-11 | Disposition: A | Discharge status: Home / Self Care | Payer: 59 | Source: Ambulatory Visit

## 2014-02-11 DIAGNOSIS — Z1231 Encounter for screening mammogram for malignant neoplasm of breast: Secondary | ICD-10-CM

## 2015-02-04 ENCOUNTER — Other Ambulatory Visit: Payer: Self-pay

## 2015-02-04 DIAGNOSIS — Z1231 Encounter for screening mammogram for malignant neoplasm of breast: Secondary | ICD-10-CM

## 2015-02-13 ENCOUNTER — Ambulatory Visit
Admission: RE | Admit: 2015-02-13 | Discharge: 2015-02-13 | Disposition: A | Discharge status: Home / Self Care | Payer: 59 | Source: Ambulatory Visit

## 2015-02-13 DIAGNOSIS — Z1231 Encounter for screening mammogram for malignant neoplasm of breast: Secondary | ICD-10-CM

## 2016-02-24 ENCOUNTER — Other Ambulatory Visit: Payer: Self-pay | Admitting: *Deleted

## 2016-02-24 DIAGNOSIS — Z1231 Encounter for screening mammogram for malignant neoplasm of breast: Secondary | ICD-10-CM

## 2016-03-02 ENCOUNTER — Ambulatory Visit
Admission: RE | Admit: 2016-03-02 | Discharge: 2016-03-02 | Disposition: A | Discharge status: Home / Self Care | Payer: 59 | Source: Ambulatory Visit | Attending: Family Medicine | Admitting: Family Medicine

## 2016-03-02 DIAGNOSIS — Z1231 Encounter for screening mammogram for malignant neoplasm of breast: Secondary | ICD-10-CM

## 2016-08-16 DIAGNOSIS — E559 Vitamin D deficiency, unspecified: Secondary | ICD-10-CM | POA: Diagnosis not present

## 2016-08-16 DIAGNOSIS — E785 Hyperlipidemia, unspecified: Secondary | ICD-10-CM | POA: Diagnosis not present

## 2016-08-16 DIAGNOSIS — Z0000 Encounter for general adult medical examination without abnormal findings: Secondary | ICD-10-CM | POA: Diagnosis not present

## 2016-08-16 DIAGNOSIS — R7309 Other abnormal glucose: Secondary | ICD-10-CM | POA: Diagnosis not present

## 2016-08-16 DIAGNOSIS — I10 Essential (primary) hypertension: Secondary | ICD-10-CM | POA: Diagnosis not present

## 2016-09-27 DIAGNOSIS — E119 Type 2 diabetes mellitus without complications: Secondary | ICD-10-CM | POA: Diagnosis not present

## 2016-09-27 DIAGNOSIS — Z23 Encounter for immunization: Secondary | ICD-10-CM | POA: Diagnosis not present

## 2017-01-05 DIAGNOSIS — I10 Essential (primary) hypertension: Secondary | ICD-10-CM | POA: Diagnosis not present

## 2017-01-05 DIAGNOSIS — E119 Type 2 diabetes mellitus without complications: Secondary | ICD-10-CM | POA: Diagnosis not present

## 2017-01-05 DIAGNOSIS — E785 Hyperlipidemia, unspecified: Secondary | ICD-10-CM | POA: Diagnosis not present

## 2017-01-24 ENCOUNTER — Other Ambulatory Visit: Payer: Self-pay | Admitting: Obstetrics and Gynecology

## 2017-01-24 DIAGNOSIS — Z1231 Encounter for screening mammogram for malignant neoplasm of breast: Secondary | ICD-10-CM

## 2017-02-16 DIAGNOSIS — H5203 Hypermetropia, bilateral: Secondary | ICD-10-CM | POA: Diagnosis not present

## 2017-03-03 ENCOUNTER — Ambulatory Visit: Payer: 59

## 2017-03-06 ENCOUNTER — Ambulatory Visit
Admission: RE | Admit: 2017-03-06 | Discharge: 2017-03-06 | Disposition: A | Discharge status: Home / Self Care | Payer: 59 | Source: Ambulatory Visit | Attending: Obstetrics and Gynecology | Admitting: Obstetrics and Gynecology

## 2017-03-06 DIAGNOSIS — Z1231 Encounter for screening mammogram for malignant neoplasm of breast: Secondary | ICD-10-CM

## 2017-06-20 DIAGNOSIS — Z01419 Encounter for gynecological examination (general) (routine) without abnormal findings: Secondary | ICD-10-CM | POA: Diagnosis not present

## 2017-08-23 DIAGNOSIS — I10 Essential (primary) hypertension: Secondary | ICD-10-CM | POA: Diagnosis not present

## 2017-08-23 DIAGNOSIS — Z0000 Encounter for general adult medical examination without abnormal findings: Secondary | ICD-10-CM | POA: Diagnosis not present

## 2017-08-23 DIAGNOSIS — Z206 Contact with and (suspected) exposure to human immunodeficiency virus [HIV]: Secondary | ICD-10-CM | POA: Diagnosis not present

## 2017-08-23 DIAGNOSIS — E119 Type 2 diabetes mellitus without complications: Secondary | ICD-10-CM | POA: Diagnosis not present

## 2017-08-23 DIAGNOSIS — E785 Hyperlipidemia, unspecified: Secondary | ICD-10-CM | POA: Diagnosis not present

## 2017-08-23 DIAGNOSIS — E559 Vitamin D deficiency, unspecified: Secondary | ICD-10-CM | POA: Diagnosis not present

## 2017-09-21 ENCOUNTER — Encounter: Payer: 59 | Attending: Family Medicine

## 2017-09-21 DIAGNOSIS — Z713 Dietary counseling and surveillance: Secondary | ICD-10-CM | POA: Diagnosis present

## 2017-09-21 DIAGNOSIS — E119 Type 2 diabetes mellitus without complications: Secondary | ICD-10-CM | POA: Diagnosis not present

## 2017-09-21 NOTE — Progress Notes (Signed)
Diabetes Self-Management Education  Visit Type: First/Initial  Appt. Start Time: 9:30 Appt. End Time: 11:00  09/21/2017  [name], identified by name and date of birth, is a 47 y.o. female with a diagnosis of Diabetes: Type 2.   ASSESSMENT  Height 5' 3.5" (1.613 m), weight 170 lb (77.1 kg). Body mass index is 29.64 kg/m.  Diabetes Self-Management Education - 09/21/17 0942      Visit Information   Visit Type  First/Initial      Initial Visit   Diabetes Type  Type 2    Are you currently following a meal plan?  No    Are you taking your medications as prescribed?  Yes    Date Diagnosed  6 months ago      Health Coping   How would you rate your overall health?  Good      Psychosocial Assessment   Patient Belief/Attitude about Diabetes  Denial    Self-care barriers  None    Self-management support  Family    Patient Concerns  Nutrition/Meal planning;Healthy Lifestyle    Special Needs  None    Preferred Learning Style  No preference indicated    Learning Readiness  Contemplating    How often do you need to have someone help you when you read instructions, pamphlets, or other written materials from your doctor or pharmacy?  1 - Never    What is the last grade level you completed in school?  BS degree      Pre-Education Assessment   Patient understands the diabetes disease and treatment process.  Needs Review    Patient understands incorporating nutritional management into lifestyle.  Needs Review    Patient undertands incorporating physical activity into lifestyle.  Demonstrates understanding / competency    Patient understands using medications safely.  Demonstrates understanding / competency    Patient understands monitoring blood glucose, interpreting and using results  Needs Instruction    Patient understands prevention, detection, and treatment of acute complications.  Needs Review    Patient understands prevention, detection, and treatment of chronic complications.   Needs Review    Patient understands how to develop strategies to address psychosocial issues.  Needs Review    Patient understands how to develop strategies to promote health/change behavior.  Needs Review      Complications   Last HgB A1C per patient/outside source  6.2 %    How often do you check your blood sugar?  0 times/day (not testing)    Have you had a dilated eye exam in the past 12 months?  Yes    Have you had a dental exam in the past 12 months?  Yes    Are you checking your feet?  No      Dietary Intake   Breakfast  None reported    Snack (morning)  None reported    Lunch  chicken wings with salad or french fries, grilled chicken with corn on cob and fried okra, chipotle salad (lettuce/chicken/house dressing), or grilled chicken salad from [employer] (afternoon)  None reported    Dinner  spaghetti, tacos, typically something with red meat    Snack (evening)  a bag of chips    Beverage(s)  water, detox tea with stevia, 1 welches cran apple juice (10 oz), occasional mt. dew      Exercise   Exercise Type  ADL's;Moderate (swimming / aerobic walking)    How many days per week to you exercise?  3    How many minutes per day do you exercise?  60    Total minutes per week of exercise  180      Patient Education   Previous Diabetes Education  Yes (please comment) over 10 years ago      Individualized Goals (developed by patient)   Nutrition  Follow meal plan discussed;General guidelines for healthy choices and portions discussed;Adjust meds/carbs with exercise as discussed    Physical Activity  Exercise 3-5 times per week    Medications  take my medication as prescribed    Monitoring   Other (comment) Pt refusing to take blood glucose levels at this time    Reducing Risk  examine blood glucose patterns;do foot checks daily;get labs drawn;treat hypoglycemia with 15 grams of carbs if blood glucose less than 70mg /dL    Health Coping  discuss diabetes with (comment) family  members      Post-Education Assessment   Patient understands the diabetes disease and treatment process.  Demonstrates understanding / competency    Patient understands incorporating nutritional management into lifestyle.  Needs Review    Patient undertands incorporating physical activity into lifestyle.  Demonstrates understanding / competency    Patient understands using medications safely.  Demonstrates understanding / competency    Patient understands monitoring blood glucose, interpreting and using results  Needs Instruction    Patient understands prevention, detection, and treatment of acute complications.  Demonstrates understanding / competency    Patient understands prevention, detection, and treatment of chronic complications.  Demonstrates understanding / competency    Patient understands how to develop strategies to address psychosocial issues.  Needs Review    Patient understands how to develop strategies to promote health/change behavior.  Demonstrates understanding / competency      Outcomes   Expected Outcomes  Demonstrated interest in learning. Expect positive outcomes    Future DMSE  PRN    Program Status  Completed      Individualized Plan for Diabetes Self-Management Training:   Learning Objective:  Patient will have a greater understanding of diabetes self-management. Patient education plan is to attend individual and/or group sessions per assessed needs and concerns.   Plan:   Patient Instructions   Try switching red meat out for ground [place]urkey or chicken  Create a well-balanced plate  Consume 2-3 carb choices at each meal and 0-2 carb choices at each snack  Look up carbohydrate content of foods at frequently attended restaurants  Continue with intentional activity   Expected Outcomes:  Demonstrated interest in learning. Expect positive outcomes  Education material provided: Living Well with Diabetes, A1C conversion sheet, Meal plan card and My  Plate  If problems or questions, patient to contact team via:  Phone  Future DSME appointment: PRN

## 2017-09-21 NOTE — Patient Instructions (Signed)
   Try switching red meat out for ground [place] or chicken  Create a well-balanced plate  Consume 2-3 carb choices at each meal and 0-2 carb choices at each snack  Look up carbohydrate content of foods at frequently attended restaurants  Continue with intentional activity

## 2017-09-28 ENCOUNTER — Ambulatory Visit: Payer: 59 | Admitting: Dietician

## 2017-10-06 DIAGNOSIS — E785 Hyperlipidemia, unspecified: Secondary | ICD-10-CM | POA: Diagnosis not present

## 2017-10-06 DIAGNOSIS — I10 Essential (primary) hypertension: Secondary | ICD-10-CM | POA: Diagnosis not present

## 2018-02-09 ENCOUNTER — Other Ambulatory Visit: Payer: Self-pay | Admitting: Obstetrics and Gynecology

## 2018-02-09 DIAGNOSIS — Z1231 Encounter for screening mammogram for malignant neoplasm of breast: Secondary | ICD-10-CM

## 2018-02-21 DIAGNOSIS — H5203 Hypermetropia, bilateral: Secondary | ICD-10-CM | POA: Diagnosis not present

## 2018-02-21 DIAGNOSIS — E119 Type 2 diabetes mellitus without complications: Secondary | ICD-10-CM | POA: Diagnosis not present

## 2018-02-26 DIAGNOSIS — Z23 Encounter for immunization: Secondary | ICD-10-CM | POA: Diagnosis not present

## 2018-02-26 DIAGNOSIS — E1169 Type 2 diabetes mellitus with other specified complication: Secondary | ICD-10-CM | POA: Diagnosis not present

## 2018-02-26 DIAGNOSIS — I10 Essential (primary) hypertension: Secondary | ICD-10-CM | POA: Diagnosis not present

## 2018-02-26 DIAGNOSIS — E785 Hyperlipidemia, unspecified: Secondary | ICD-10-CM | POA: Diagnosis not present

## 2018-03-09 ENCOUNTER — Ambulatory Visit: Payer: 59

## 2018-03-30 DIAGNOSIS — M9905 Segmental and somatic dysfunction of pelvic region: Secondary | ICD-10-CM | POA: Diagnosis not present

## 2018-03-30 DIAGNOSIS — M9902 Segmental and somatic dysfunction of thoracic region: Secondary | ICD-10-CM | POA: Diagnosis not present

## 2018-03-30 DIAGNOSIS — M9903 Segmental and somatic dysfunction of lumbar region: Secondary | ICD-10-CM | POA: Diagnosis not present

## 2018-04-02 DIAGNOSIS — M9905 Segmental and somatic dysfunction of pelvic region: Secondary | ICD-10-CM | POA: Diagnosis not present

## 2018-04-02 DIAGNOSIS — M9902 Segmental and somatic dysfunction of thoracic region: Secondary | ICD-10-CM | POA: Diagnosis not present

## 2018-04-02 DIAGNOSIS — M9903 Segmental and somatic dysfunction of lumbar region: Secondary | ICD-10-CM | POA: Diagnosis not present

## 2018-04-09 DIAGNOSIS — M9902 Segmental and somatic dysfunction of thoracic region: Secondary | ICD-10-CM | POA: Diagnosis not present

## 2018-04-09 DIAGNOSIS — M9905 Segmental and somatic dysfunction of pelvic region: Secondary | ICD-10-CM | POA: Diagnosis not present

## 2018-04-09 DIAGNOSIS — M9903 Segmental and somatic dysfunction of lumbar region: Secondary | ICD-10-CM | POA: Diagnosis not present

## 2018-04-11 ENCOUNTER — Ambulatory Visit
Admission: RE | Admit: 2018-04-11 | Discharge: 2018-04-11 | Disposition: A | Discharge status: Home / Self Care | Payer: 59 | Source: Ambulatory Visit | Attending: Obstetrics and Gynecology | Admitting: Obstetrics and Gynecology

## 2018-04-11 DIAGNOSIS — Z1231 Encounter for screening mammogram for malignant neoplasm of breast: Secondary | ICD-10-CM

## 2018-07-10 DIAGNOSIS — M5432 Sciatica, left side: Secondary | ICD-10-CM | POA: Diagnosis not present

## 2018-07-16 DIAGNOSIS — M5416 Radiculopathy, lumbar region: Secondary | ICD-10-CM | POA: Diagnosis not present

## 2018-07-25 DIAGNOSIS — M5416 Radiculopathy, lumbar region: Secondary | ICD-10-CM | POA: Diagnosis not present

## 2018-07-27 DIAGNOSIS — M5416 Radiculopathy, lumbar region: Secondary | ICD-10-CM | POA: Diagnosis not present

## 2018-07-30 DIAGNOSIS — M5416 Radiculopathy, lumbar region: Secondary | ICD-10-CM | POA: Diagnosis not present

## 2018-08-01 DIAGNOSIS — M5416 Radiculopathy, lumbar region: Secondary | ICD-10-CM | POA: Diagnosis not present

## 2018-08-06 DIAGNOSIS — M5416 Radiculopathy, lumbar region: Secondary | ICD-10-CM | POA: Diagnosis not present

## 2018-08-08 DIAGNOSIS — M5416 Radiculopathy, lumbar region: Secondary | ICD-10-CM | POA: Diagnosis not present

## 2018-08-13 DIAGNOSIS — M5416 Radiculopathy, lumbar region: Secondary | ICD-10-CM | POA: Diagnosis not present

## 2018-08-20 DIAGNOSIS — M5416 Radiculopathy, lumbar region: Secondary | ICD-10-CM | POA: Diagnosis not present

## 2018-08-22 DIAGNOSIS — M5416 Radiculopathy, lumbar region: Secondary | ICD-10-CM | POA: Diagnosis not present

## 2018-08-27 DIAGNOSIS — E785 Hyperlipidemia, unspecified: Secondary | ICD-10-CM | POA: Diagnosis not present

## 2018-08-27 DIAGNOSIS — E1169 Type 2 diabetes mellitus with other specified complication: Secondary | ICD-10-CM | POA: Diagnosis not present

## 2018-08-27 DIAGNOSIS — I10 Essential (primary) hypertension: Secondary | ICD-10-CM | POA: Diagnosis not present

## 2018-08-30 DIAGNOSIS — E1169 Type 2 diabetes mellitus with other specified complication: Secondary | ICD-10-CM | POA: Diagnosis not present

## 2018-08-30 DIAGNOSIS — E559 Vitamin D deficiency, unspecified: Secondary | ICD-10-CM | POA: Diagnosis not present

## 2018-08-30 DIAGNOSIS — E785 Hyperlipidemia, unspecified: Secondary | ICD-10-CM | POA: Diagnosis not present

## 2018-09-04 DIAGNOSIS — Z01419 Encounter for gynecological examination (general) (routine) without abnormal findings: Secondary | ICD-10-CM | POA: Diagnosis not present

## 2018-09-04 DIAGNOSIS — Z6830 Body mass index (BMI) 30.0-30.9, adult: Secondary | ICD-10-CM | POA: Diagnosis not present

## 2019-03-05 ENCOUNTER — Other Ambulatory Visit: Payer: Self-pay | Admitting: Obstetrics and Gynecology

## 2019-03-05 DIAGNOSIS — Z1231 Encounter for screening mammogram for malignant neoplasm of breast: Secondary | ICD-10-CM

## 2019-04-04 ENCOUNTER — Other Ambulatory Visit: Payer: Self-pay

## 2019-04-04 DIAGNOSIS — Z20822 Contact with and (suspected) exposure to covid-19: Secondary | ICD-10-CM

## 2019-04-06 LAB — NOVEL CORONAVIRUS, NAA: SARS-CoV-2, NAA: NOT DETECTED

## 2019-04-19 ENCOUNTER — Ambulatory Visit
Admission: RE | Admit: 2019-04-19 | Discharge: 2019-04-19 | Disposition: A | Discharge status: Home / Self Care | Payer: 59 | Source: Ambulatory Visit | Attending: Obstetrics and Gynecology | Admitting: Obstetrics and Gynecology

## 2019-04-19 ENCOUNTER — Other Ambulatory Visit: Payer: Self-pay

## 2019-04-19 DIAGNOSIS — Z1231 Encounter for screening mammogram for malignant neoplasm of breast: Secondary | ICD-10-CM

## 2020-02-27 IMAGING — MG DIGITAL SCREENING BILAT W/ TOMO W/ CAD
8 series · 8 of 24 positions shown · non-contrast
Comparison: Previous exam(s).

CLINICAL DATA: Screening.

EXAM:
DIGITAL SCREENING BILATERAL MAMMOGRAM WITH TOMO AND CAD

[R CC synth-2D]
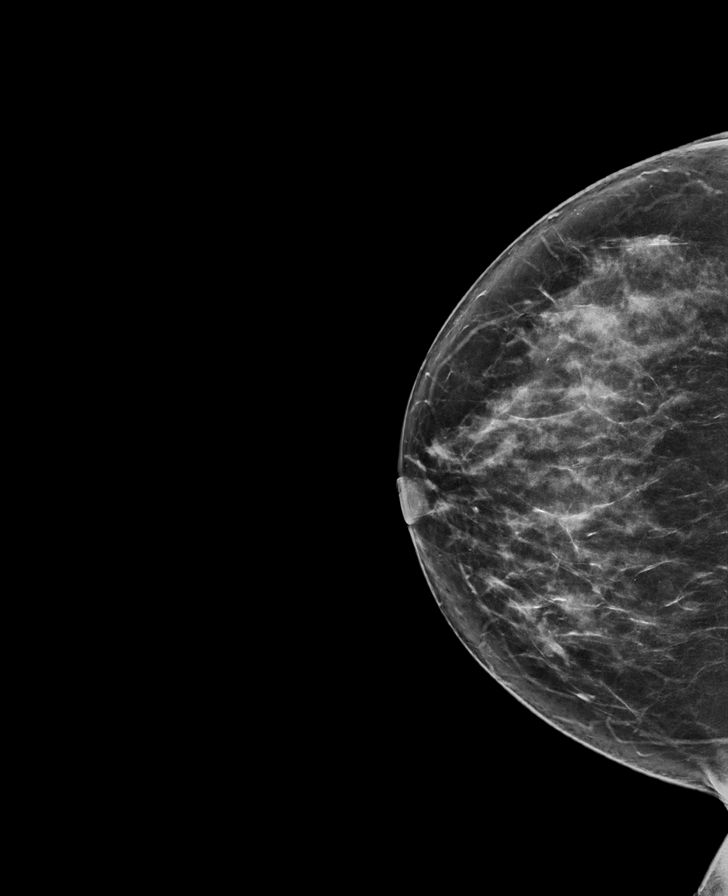

[L MLO synth-2D]
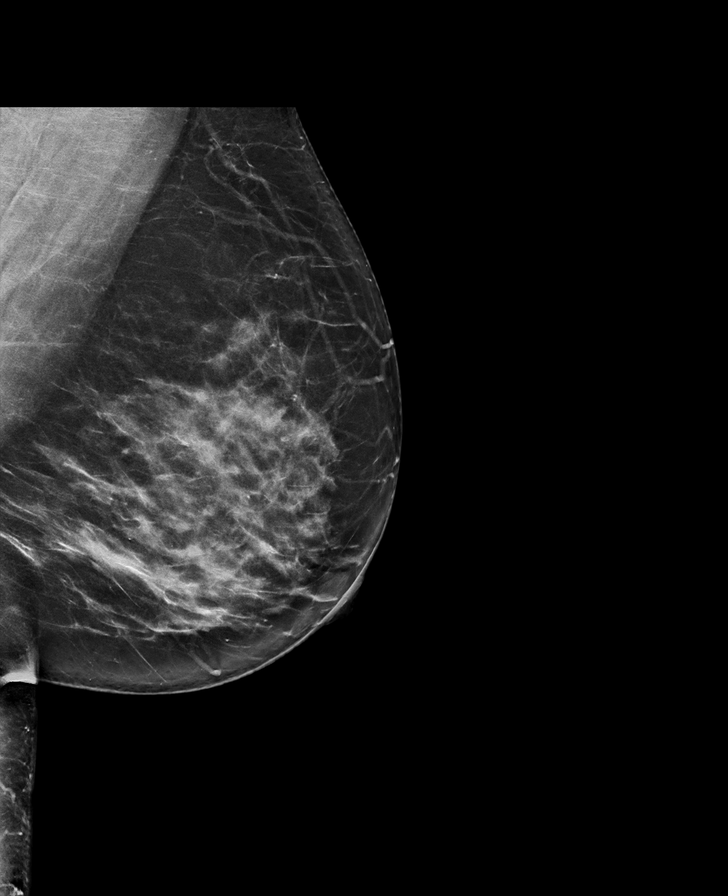

[L CC synth-2D]
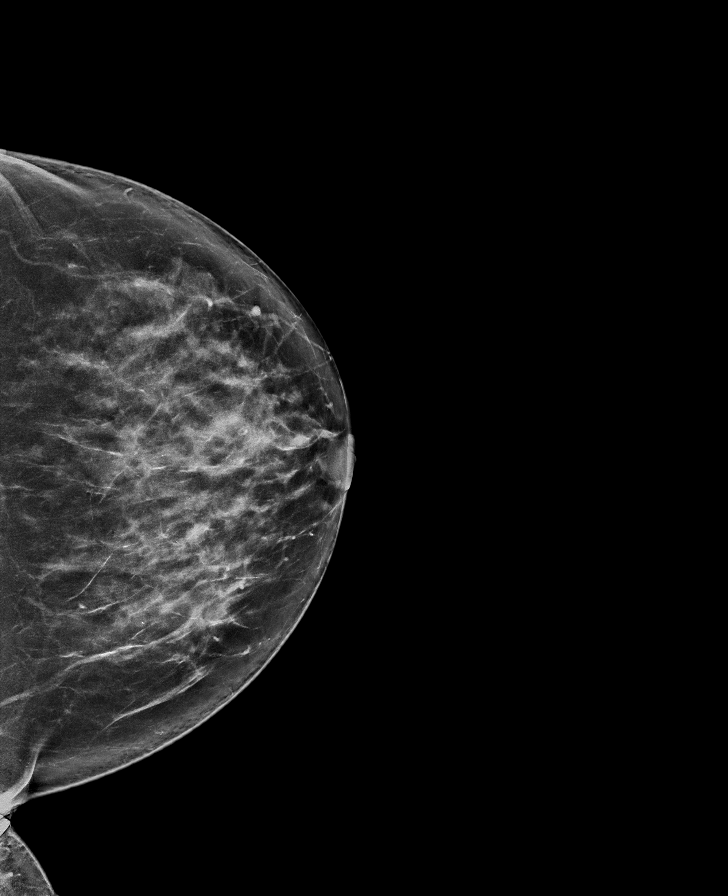

[R MLO synth-2D]
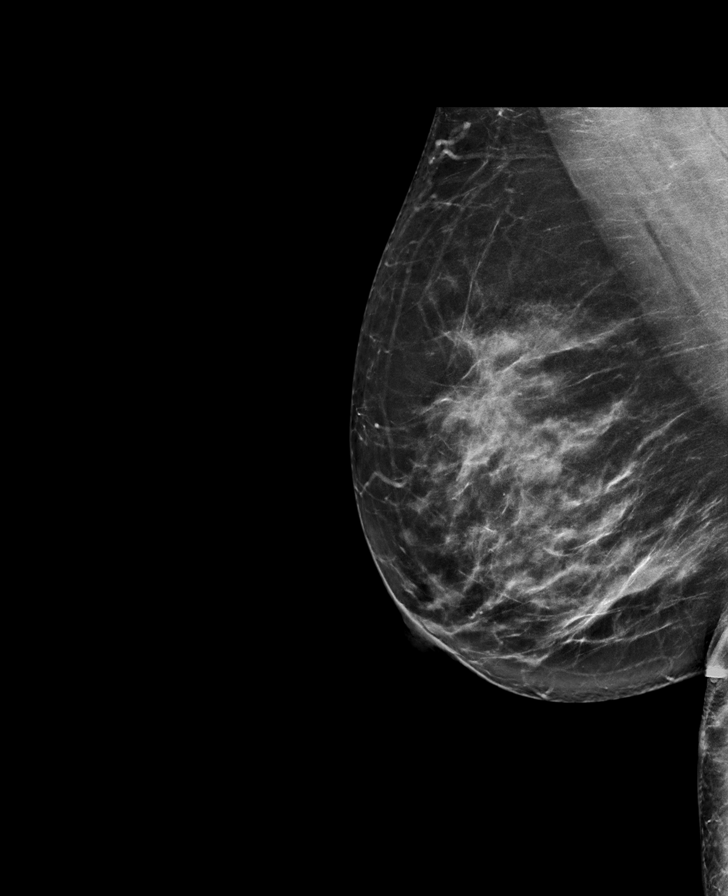

[L CC tomo · tomo slice 41/81.0]
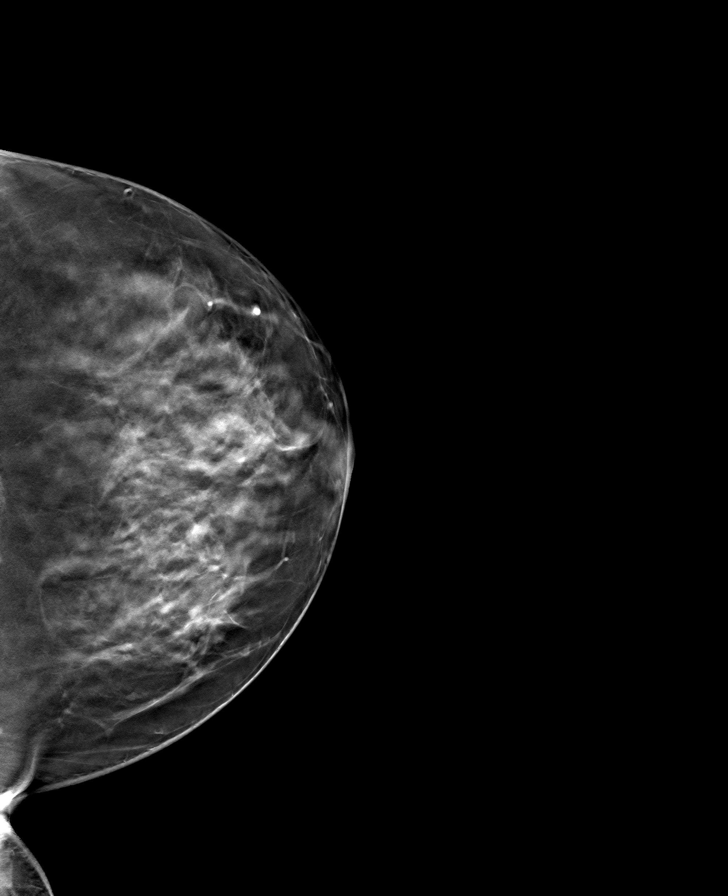

[R MLO tomo · tomo slice 42/83.0]
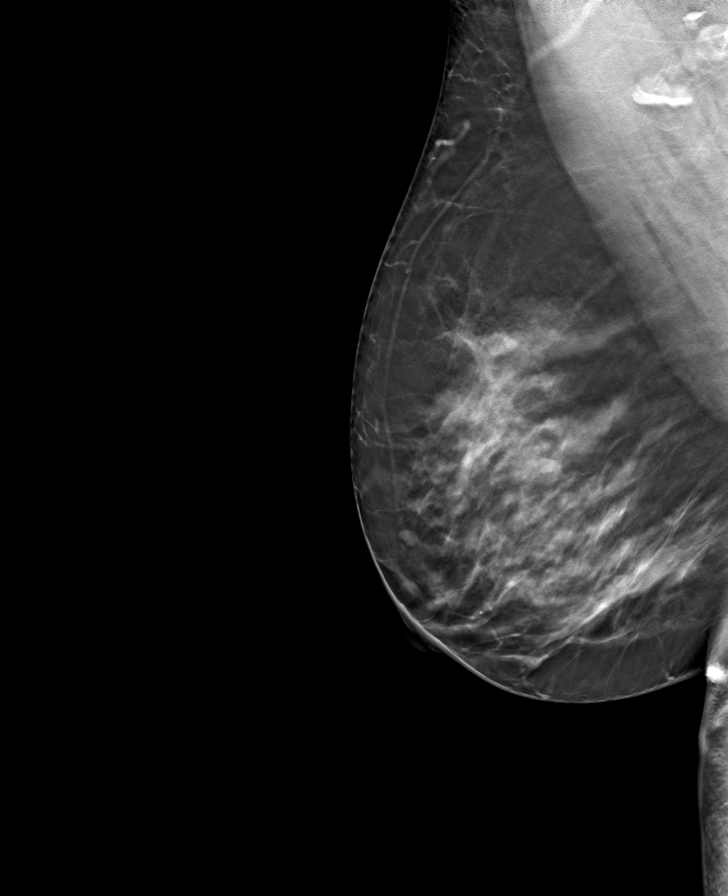

[L MLO tomo · tomo slice 41/81.0]
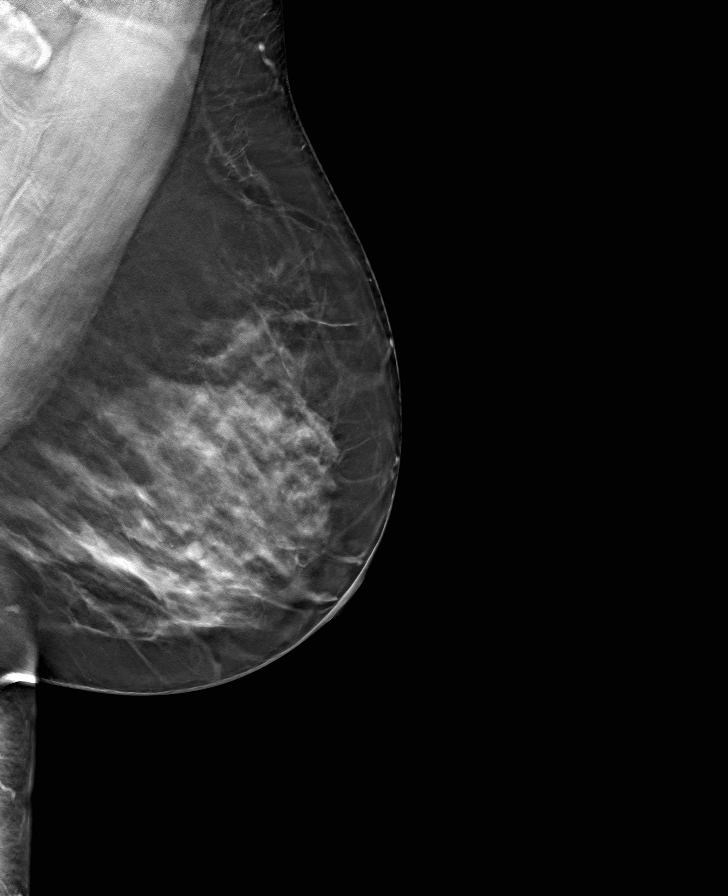

[R CC tomo · tomo slice 39/76.0]
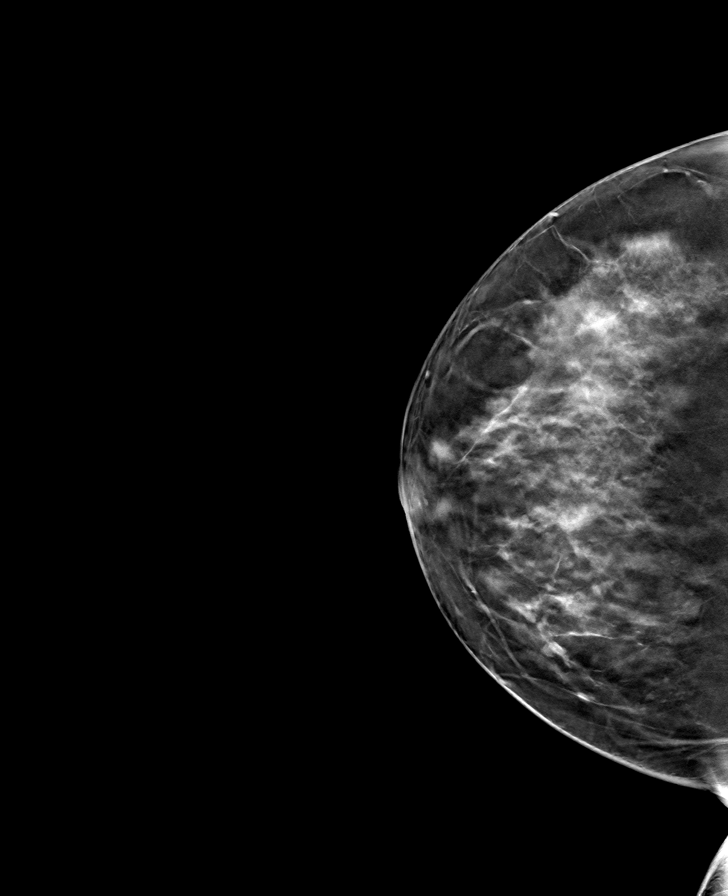

[8 of 24 positions shown; findings below may reference images not displayed]

ACR Breast Density Category c: The breast tissue is heterogeneously
dense, which may obscure small masses.
FINDINGS: There are no findings suspicious for malignancy. Images were
processed with CAD.
IMPRESSION: No mammographic evidence of malignancy. A result letter of this
screening mammogram will be mailed directly to the patient.

RECOMMENDATION:
Screening mammogram in one year. (Code:FT-U-LHB)

BI-RADS CATEGORY  1: Negative.

## 2020-03-17 ENCOUNTER — Other Ambulatory Visit: Payer: Self-pay | Admitting: Obstetrics and Gynecology

## 2020-03-17 DIAGNOSIS — Z1231 Encounter for screening mammogram for malignant neoplasm of breast: Secondary | ICD-10-CM

## 2020-05-04 ENCOUNTER — Ambulatory Visit: Payer: 59

## 2020-05-07 ENCOUNTER — Other Ambulatory Visit: Payer: Self-pay

## 2020-05-07 ENCOUNTER — Ambulatory Visit
Admission: RE | Admit: 2020-05-07 | Discharge: 2020-05-07 | Disposition: A | Discharge status: Home / Self Care | Payer: 59 | Source: Ambulatory Visit | Attending: Obstetrics and Gynecology | Admitting: Obstetrics and Gynecology

## 2020-05-07 DIAGNOSIS — Z1231 Encounter for screening mammogram for malignant neoplasm of breast: Secondary | ICD-10-CM

## 2020-05-28 ENCOUNTER — Ambulatory Visit: Payer: 59

## 2021-03-24 ENCOUNTER — Other Ambulatory Visit: Payer: Self-pay | Admitting: Obstetrics and Gynecology

## 2021-03-24 DIAGNOSIS — Z1231 Encounter for screening mammogram for malignant neoplasm of breast: Secondary | ICD-10-CM

## 2021-05-10 ENCOUNTER — Ambulatory Visit: Payer: 59

## 2021-05-11 ENCOUNTER — Ambulatory Visit
Admission: RE | Admit: 2021-05-11 | Discharge: 2021-05-11 | Disposition: A | Discharge status: Home / Self Care | Payer: 59 | Source: Ambulatory Visit | Attending: Obstetrics and Gynecology | Admitting: Obstetrics and Gynecology

## 2021-05-11 DIAGNOSIS — Z1231 Encounter for screening mammogram for malignant neoplasm of breast: Secondary | ICD-10-CM

## 2021-07-23 ENCOUNTER — Other Ambulatory Visit: Payer: Self-pay

## 2021-07-23 ENCOUNTER — Ambulatory Visit
Admission: EM | Admit: 2021-07-23 | Discharge: 2021-07-23 | Disposition: A | Discharge status: Home / Self Care | Payer: 59

## 2021-07-23 DIAGNOSIS — J069 Acute upper respiratory infection, unspecified: Secondary | ICD-10-CM | POA: Diagnosis not present

## 2021-07-23 MED ORDER — FLUTICASONE PROPIONATE 50 MCG/ACT NA SUSP: 1.0000 | Freq: Every day | NASAL | 0 refills | Status: AC

## 2021-07-23 MED ORDER — BENZONATATE 100 MG PO CAPS: 100.0000 mg | ORAL_CAPSULE | Freq: Three times a day (TID) | ORAL | 0 refills | Status: AC | PRN

## 2021-07-23 NOTE — ED Triage Notes (Signed)
4 day h/o cough, runny nose, congestion and sore throat. Afebrile. No v/d. Has been taking dayquil and nyquil with some relief.

## 2021-07-23 NOTE — ED Provider Notes (Signed)
[facility]    CSN: [id number] Arrival date & time: 07/23/21  0818      History   Chief Complaint Chief Complaint  Patient presents with   Nasal Congestion    HPI TICIA VIRGO is a 51 y.o. female.   Patient presents with 4-day history nonproductive cough, runny nose, nasal congestion, sore throat.  Denies any known fevers or sick contacts.  Denies chest pain, shortness of breath, nausea, vomiting, diarrhea, abdominal pain.  Patient has been taking DayQuil and NyQuil that has provided her some relief.  Denies history of asthma or COPD.    Past Medical History:  Diagnosis Date   Hypertension     Patient Active Problem List   Diagnosis Date Noted   HYPOTHYROIDISM 05/16/2007   SINUSITIS, ACUTE NOS 08/30/2006   ALLERGIC RHINITIS 08/30/2006   HYPERTENSION 07/12/2006    Past Surgical History:  Procedure Laterality Date   BILATERAL SALPINGECTOMY Bilateral 07/17/2012   Procedure: BILATERAL SALPINGECTOMY;  Surgeon: Lenoard Aden, MD;  Location: [facility];  Service: Gynecology;  Laterality: Bilateral;   DILATION AND CURETTAGE OF UTERUS     NOVASURE ABLATION  2001   POLYPECTOMY     ROBOTIC ASSISTED TOTAL HYSTERECTOMY N/A 07/17/2012   Procedure: ROBOTIC ASSISTED TOTAL HYSTERECTOMY;  Surgeon: Lenoard Aden, MD;  Location: [facility];  Service: Gynecology;  Laterality: N/A;    OB History   No obstetric history on file.      Home Medications    Prior to Admission medications   Medication Sig Start Date End Date Taking? Authorizing Provider  benzonatate (TESSALON) 100 MG capsule Take 1 capsule (100 mg total) by mouth every 8 (eight) hours as needed for cough. 07/23/21  Yes Iysis Germain, Rolly Salter E, FNP  fluticasone (FLONASE) 50 MCG/ACT nasal spray Place 1 spray into both nostrils daily for 3 days. 07/23/21 07/26/21 Yes Sivan Cuello, Acie Fredrickson, FNP  valsartan-hydrochlorothiazide (DIOVAN-HCT) 320-25 MG tablet 1 tablet 10/18/17  Yes [provider]  amLODipine (NORVASC) 5 MG  tablet Take 5 mg by mouth daily. 07/10/21   [provider]  atorvastatin (LIPITOR) 20 MG tablet Take 20 mg by mouth daily.    [provider]  azithromycin (ZITHROMAX) 250 MG tablet Take 250 mg by mouth daily.    [provider]  ibuprofen (ADVIL,MOTRIN) 800 MG tablet Take 800 mg by mouth every 8 (eight) hours as needed. Headache/Pain    [provider]  lisinopril (PRINIVIL,ZESTRIL) 20 MG tablet Take 20 mg by mouth daily.    [provider]  metFORMIN (GLUCOPHAGE) 500 MG tablet Take 500 mg by mouth 2 (two) times daily with a meal.    [provider]  Multiple Vitamin (MULTIVITAMIN WITH MINERALS) TABS Take 1 tablet by mouth 2 (two) times a week.    [provider]  NIFEdipine (PROCARDIA XL/ADALAT-CC) 60 MG 24 hr tablet Take 60 mg by mouth daily.    [provider]  oxyCODONE-acetaminophen (PERCOCET/ROXICET) 5-325 MG per tablet Take 1-2 tablets by mouth every 4 (four) hours as needed. Patient not taking: Reported on 09/21/2017 07/18/12   Olivia Mackie, MD  RYBELSUS 3 MG TABS Take 1 tablet by mouth every morning. 05/06/21   [provider]  traMADol (ULTRAM) 50 MG tablet Take 1-2 tablets (50-100 mg total) by mouth every 6 (six) hours as needed. Patient not taking: Reported on 09/21/2017 07/18/12   Olivia Mackie, MD  triamterene-hydrochlorothiazide (MAXZIDE-25) 37.5-25 MG per tablet Take 1 tablet by mouth daily.  [provider]  Vitamin D, Ergocalciferol, (DRISDOL) 50000 UNITS CAPS Take 50,000 Units by mouth every 7 (seven) days.    [provider]    Family History Family History  Problem Relation Age of Onset   Hypertension Mother    Breast cancer Neg Hx     Social History Social History   Tobacco Use   Smoking status: Never  Substance Use Topics   Alcohol use: Yes    Comment: occasionally   Drug use: No     Allergies   Patient has no known allergies.   Review of Systems Review  of Systems Per HPI  Physical Exam Triage Vital Signs ED Triage Vitals  Enc Vitals Group     BP 07/23/21 0855 132/81     Pulse Rate 07/23/21 0855 66     Resp 07/23/21 0855 18     Temp 07/23/21 0855 97.7 F (36.5 C)     Temp Source 07/23/21 0855 Oral     SpO2 07/23/21 0855 95 %     Weight --      Height --      Head Circumference --      Peak Flow --      Pain Score 07/23/21 0857 0     Pain Loc --      Pain Edu? --      Excl. in GC? --    No data found.  Updated Vital Signs BP 132/81 (BP Location: Left Arm)    Pulse 66    Temp 97.7 F (36.5 C) (Oral)    Resp 18    LMP 06/24/2012    SpO2 95%   Visual Acuity Right Eye Distance:   Left Eye Distance:   Bilateral Distance:    Right Eye Near:   Left Eye Near:    Bilateral Near:     Physical Exam Constitutional:      General: She is not in acute distress.    Appearance: Normal appearance. She is not toxic-appearing or diaphoretic.  HENT:     Head: Normocephalic and atraumatic.     Right Ear: Tympanic membrane and ear canal normal.     Left Ear: Tympanic membrane and ear canal normal.     Nose: Congestion present.     Mouth/Throat:     Mouth: Mucous membranes are moist.     Pharynx: No posterior oropharyngeal erythema.  Eyes:     Extraocular Movements: Extraocular movements intact.     Conjunctiva/sclera: Conjunctivae normal.     Pupils: Pupils are equal, round, and reactive to light.  Cardiovascular:     Rate and Rhythm: Normal rate and regular rhythm.     Pulses: Normal pulses.     Heart sounds: Normal heart sounds.  Pulmonary:     Effort: Pulmonary effort is normal. No respiratory distress.     Breath sounds: Normal breath sounds. No stridor. No wheezing, rhonchi or rales.  Abdominal:     General: Abdomen is flat. Bowel sounds are normal.     Palpations: Abdomen is soft.  Musculoskeletal:        General: Normal range of motion.     Cervical back: Normal range of motion.  Skin:    General: Skin is warm and  dry.  Neurological:     General: No focal deficit present.     Mental Status: She is alert and oriented to person, place, and time. Mental status is at baseline.  Psychiatric:  Mood and Affect: Mood normal.        Behavior: Behavior normal.     UC Treatments / Results  Labs (all labs ordered are listed, but only abnormal results are displayed) Labs Reviewed  COVID-19, FLU A+B NAA    EKG   Radiology No results found.  Procedures Procedures (including critical care time)  Medications Ordered in UC Medications - No data to display  Initial Impression / Assessment and Plan / UC Course  I have reviewed the triage vital signs and the nursing notes.  Pertinent labs & imaging results that were available during my care of the patient were reviewed by me and considered in my medical decision making (see chart for details).     Patient presents with symptoms likely from a viral upper respiratory infection. Differential includes bacterial pneumonia, sinusitis, allergic rhinitis, COVID, flu. Do not suspect underlying cardiopulmonary process. Symptoms seem unlikely related to ACS, CHF or COPD exacerbations, pneumonia, pneumothorax. Patient is nontoxic appearing and not in need of emergent medical intervention.  No suspicion for strep throat.  COVID and flu test are pending.  Recommended symptom control with over the counter medications.  Patient sent prescriptions.  Return if symptoms fail to improve in 1-2 weeks or you develop shortness of breath, chest pain, severe headache. Patient states understanding and is agreeable.  Discharged with PCP followup.  Final Clinical Impressions(s) / UC Diagnoses   Final diagnoses:  Viral upper respiratory tract infection with cough     Discharge Instructions      It appears that you have a viral upper respiratory infection that should self resolve with symptomatic treatment in the next few days.  You have been prescribed 2 medications  to help alleviate symptoms.  COVID and flu test is pending.  We will call if it is positive.  Please follow-up if symptoms persist or worsen.    ED Prescriptions     Medication Sig Dispense Auth. Provider   fluticasone (FLONASE) 50 MCG/ACT nasal spray Place 1 spray into both nostrils daily for 3 days. 16 g Nieshia Larmon, Rolly Salter E, [place]   benzonatate (TESSALON) 100 MG capsule Take 1 capsule (100 mg total) by mouth every 8 (eight) hours as needed for cough. 21 capsule [place], Acie Fredrickson, [place]      PDMP not reviewed this encounter.   Gustavus Bryant, [place] 07/23/21 [phone]

## 2021-07-23 NOTE — Discharge Instructions (Signed)
It appears that you have a viral upper respiratory infection that should self resolve with symptomatic treatment in the next few days.  You have been prescribed 2 medications to help alleviate symptoms.  COVID and flu test is pending.  We will call if it is positive.  Please follow-up if symptoms persist or worsen.

## 2021-07-24 LAB — COVID-19, FLU A+B NAA
Influenza A, NAA: NOT DETECTED
Influenza B, NAA: NOT DETECTED
SARS-CoV-2, NAA: NOT DETECTED

## 2022-03-29 ENCOUNTER — Other Ambulatory Visit: Payer: Self-pay | Admitting: Obstetrics and Gynecology

## 2022-03-29 DIAGNOSIS — Z1231 Encounter for screening mammogram for malignant neoplasm of breast: Secondary | ICD-10-CM

## 2022-05-20 ENCOUNTER — Ambulatory Visit
Admission: RE | Admit: 2022-05-20 | Discharge: 2022-05-20 | Disposition: A | Discharge status: Home / Self Care | Payer: 59 | Source: Ambulatory Visit | Attending: Obstetrics and Gynecology | Admitting: Obstetrics and Gynecology

## 2022-05-20 DIAGNOSIS — Z1231 Encounter for screening mammogram for malignant neoplasm of breast: Secondary | ICD-10-CM

## 2023-04-24 ENCOUNTER — Other Ambulatory Visit: Payer: Self-pay | Admitting: Obstetrics and Gynecology

## 2023-04-24 DIAGNOSIS — Z0000 Encounter for general adult medical examination without abnormal findings: Secondary | ICD-10-CM

## 2023-05-26 ENCOUNTER — Ambulatory Visit: Payer: 59

## 2023-06-14 ENCOUNTER — Ambulatory Visit
Admission: RE | Admit: 2023-06-14 | Discharge: 2023-06-14 | Disposition: A | Discharge status: Home / Self Care | Payer: 59 | Source: Ambulatory Visit | Attending: Obstetrics and Gynecology | Admitting: Obstetrics and Gynecology

## 2023-06-14 DIAGNOSIS — Z0000 Encounter for general adult medical examination without abnormal findings: Secondary | ICD-10-CM

## 2024-05-20 ENCOUNTER — Other Ambulatory Visit: Payer: Self-pay | Admitting: Family Medicine

## 2024-05-20 DIAGNOSIS — Z1231 Encounter for screening mammogram for malignant neoplasm of breast: Secondary | ICD-10-CM

## 2024-06-07 ENCOUNTER — Other Ambulatory Visit: Payer: Self-pay | Admitting: Medical Genetics

## 2024-06-14 ENCOUNTER — Ambulatory Visit
Admission: RE | Admit: 2024-06-14 | Discharge: 2024-06-14 | Disposition: A | Discharge status: Home / Self Care | Source: Ambulatory Visit | Attending: Family Medicine | Admitting: Family Medicine

## 2024-06-14 DIAGNOSIS — Z1231 Encounter for screening mammogram for malignant neoplasm of breast: Secondary | ICD-10-CM
